# Patient Record
Sex: Male | Born: 1945 | Race: Black or African American | Hispanic: No | Marital: Single | Smoking: Current every day smoker
Health system: Southern US, Community
[De-identification: ages and names within clinical notes are randomized; demographics above are authoritative.]

## PROBLEM LIST (undated history)

## (undated) DIAGNOSIS — I10 Essential (primary) hypertension: Secondary | ICD-10-CM

## (undated) DIAGNOSIS — G8929 Other chronic pain: Secondary | ICD-10-CM

## (undated) DIAGNOSIS — M549 Dorsalgia, unspecified: Secondary | ICD-10-CM

## (undated) DIAGNOSIS — J449 Chronic obstructive pulmonary disease, unspecified: Secondary | ICD-10-CM

---

## 1998-05-28 ENCOUNTER — Emergency Department (HOSPITAL_COMMUNITY): Admission: EM | Admit: 1998-05-28 | Discharge: 1998-05-28 | Payer: Self-pay | Admitting: Emergency Medicine

## 1998-05-28 ENCOUNTER — Encounter: Payer: Self-pay | Admitting: Emergency Medicine

## 2002-01-14 ENCOUNTER — Encounter: Payer: Self-pay | Admitting: Emergency Medicine

## 2002-01-14 ENCOUNTER — Emergency Department (HOSPITAL_COMMUNITY): Admission: EM | Admit: 2002-01-14 | Discharge: 2002-01-15 | Payer: Self-pay | Admitting: Emergency Medicine

## 2012-02-29 ENCOUNTER — Encounter (HOSPITAL_COMMUNITY): Payer: Self-pay | Admitting: Emergency Medicine

## 2012-02-29 ENCOUNTER — Emergency Department (HOSPITAL_COMMUNITY)
Admission: EM | Admit: 2012-02-29 | Discharge: 2012-02-29 | Disposition: A | Discharge status: Home / Self Care | Payer: Medicare Other | Attending: Emergency Medicine | Admitting: Emergency Medicine

## 2012-02-29 ENCOUNTER — Emergency Department (HOSPITAL_COMMUNITY): Payer: Medicare Other

## 2012-02-29 DIAGNOSIS — F17200 Nicotine dependence, unspecified, uncomplicated: Secondary | ICD-10-CM | POA: Insufficient documentation

## 2012-02-29 DIAGNOSIS — S0003XA Contusion of scalp, initial encounter: Secondary | ICD-10-CM | POA: Insufficient documentation

## 2012-02-29 DIAGNOSIS — S0083XA Contusion of other part of head, initial encounter: Secondary | ICD-10-CM

## 2012-02-29 DIAGNOSIS — F10129 Alcohol abuse with intoxication, unspecified: Secondary | ICD-10-CM

## 2012-02-29 DIAGNOSIS — K047 Periapical abscess without sinus: Secondary | ICD-10-CM | POA: Insufficient documentation

## 2012-02-29 DIAGNOSIS — Z23 Encounter for immunization: Secondary | ICD-10-CM | POA: Insufficient documentation

## 2012-02-29 DIAGNOSIS — F1010 Alcohol abuse, uncomplicated: Secondary | ICD-10-CM | POA: Insufficient documentation

## 2012-02-29 DIAGNOSIS — S1093XA Contusion of unspecified part of neck, initial encounter: Secondary | ICD-10-CM | POA: Insufficient documentation

## 2012-02-29 LAB — BASIC METABOLIC PANEL
BUN: 10 mg/dL (ref 6–23)
CO2: 26 mEq/L (ref 19–32)
Calcium: 8.8 mg/dL (ref 8.4–10.5)
Chloride: 96 mEq/L (ref 96–112)
Creatinine, Ser: 0.74 mg/dL (ref 0.50–1.35)
GFR calc Af Amer: >90 mL/min (ref >90)
GFR calc non Af Amer: >90 mL/min (ref >90)
Glucose, Bld: 116 mg/dL — ABNORMAL HIGH (ref 70–99)
Potassium: 4.4 mEq/L (ref 3.5–5.1)
Sodium: 135 mEq/L (ref 135–145)

## 2012-02-29 LAB — RAPID URINE DRUG SCREEN, HOSP PERFORMED
Amphetamines: NOT DETECTED
Barbiturates: NOT DETECTED
Benzodiazepines: NOT DETECTED
Cocaine: NOT DETECTED
Opiates: NOT DETECTED
Tetrahydrocannabinol: NOT DETECTED

## 2012-02-29 LAB — CBC
HCT: 41.3 % (ref 39.0–52.0)
Hemoglobin: 14.3 g/dL (ref 13.0–17.0)
MCH: 33.6 pg (ref 26.0–34.0)
MCHC: 34.6 g/dL (ref 30.0–36.0)
MCV: 97.2 fL (ref 78.0–100.0)
Platelets: 231 10*3/uL (ref 150–400)
RBC: 4.25 MIL/uL (ref 4.22–5.81)
RDW: 14.5 % (ref 11.5–15.5)
WBC: 7.5 10*3/uL (ref 4.0–10.5)

## 2012-02-29 LAB — ETHANOL: Alcohol, Ethyl (B): 274 mg/dL — ABNORMAL HIGH (ref 0–11)

## 2012-02-29 MED ORDER — PENICILLIN V POTASSIUM 500 MG PO TABS: 500.0000 mg | ORAL_TABLET | Freq: Three times a day (TID) | ORAL | Status: DC

## 2012-02-29 MED ORDER — TETANUS-DIPHTH-ACELL PERTUSSIS 5-2.5-18.5 LF-MCG/0.5 IM SUSP
0.5000 mL | Freq: Once | INTRAMUSCULAR | Status: AC
  Administered 2012-02-29: 0.5 mL via INTRAMUSCULAR
  Filled 2012-02-29 (×2): qty 0.5
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
## 2012-02-29 NOTE — ED Notes (Signed)
Family leaving, request called if Worthy Rancher, [phone], cell
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
## 2012-03-03 NOTE — ED Provider Notes (Signed)
Medical screening examination/treatment/procedure(s) were performed by non-physician practitioner and as supervising physician I was immediately available for consultation/collaboration.   Joliyah Lippens L Miken Stecher, MD 03/03/12 0718 

## 2012-10-07 ENCOUNTER — Encounter (HOSPITAL_COMMUNITY): Payer: Self-pay

## 2012-10-07 ENCOUNTER — Emergency Department (HOSPITAL_COMMUNITY)
Admission: EM | Admit: 2012-10-07 | Discharge: 2012-10-07 | Disposition: A | Discharge status: Home / Self Care | Payer: Medicare Other | Attending: Emergency Medicine | Admitting: Emergency Medicine

## 2012-10-07 ENCOUNTER — Emergency Department (HOSPITAL_COMMUNITY): Payer: Medicare Other

## 2012-10-07 DIAGNOSIS — M545 Low back pain, unspecified: Secondary | ICD-10-CM | POA: Insufficient documentation

## 2012-10-07 DIAGNOSIS — G8929 Other chronic pain: Secondary | ICD-10-CM | POA: Insufficient documentation

## 2012-10-07 DIAGNOSIS — Z79899 Other long term (current) drug therapy: Secondary | ICD-10-CM | POA: Insufficient documentation

## 2012-10-07 DIAGNOSIS — R350 Frequency of micturition: Secondary | ICD-10-CM | POA: Insufficient documentation

## 2012-10-07 DIAGNOSIS — F17200 Nicotine dependence, unspecified, uncomplicated: Secondary | ICD-10-CM | POA: Insufficient documentation

## 2012-10-07 DIAGNOSIS — F1010 Alcohol abuse, uncomplicated: Secondary | ICD-10-CM | POA: Insufficient documentation

## 2012-10-07 DIAGNOSIS — R42 Dizziness and giddiness: Secondary | ICD-10-CM | POA: Insufficient documentation

## 2012-10-07 HISTORY — DX: Other chronic pain: G89.29

## 2012-10-07 HISTORY — DX: Dorsalgia, unspecified: M54.9

## 2012-10-07 LAB — URINALYSIS, ROUTINE W REFLEX MICROSCOPIC
Ketones, ur: NEGATIVE mg/dL
Leukocytes, UA: NEGATIVE
Nitrite: NEGATIVE
pH: 5.5 (ref 5.0–8.0)

## 2012-10-07 LAB — POCT I-STAT, CHEM 8
Calcium, Ion: 1.2 mmol/L (ref 1.13–1.30)
Chloride: 106 mEq/L (ref 96–112)
HCT: 41 % (ref 39.0–52.0)
Sodium: 138 mEq/L (ref 135–145)
TCO2: 22 mmol/L (ref 0–100)

## 2012-10-07 MED ORDER — CYCLOBENZAPRINE HCL 10 MG PO TABS: 10.0000 mg | ORAL_TABLET | Freq: Three times a day (TID) | ORAL | Status: DC | PRN

## 2012-10-07 MED ORDER — TRAMADOL HCL 50 MG PO TABS: 50.0000 mg | ORAL_TABLET | Freq: Four times a day (QID) | ORAL | Status: DC | PRN

## 2012-10-07 MED ORDER — TRAMADOL HCL 50 MG PO TABS
50.0000 mg | ORAL_TABLET | Freq: Once | ORAL | Status: AC
  Administered 2012-10-07: 50 mg via ORAL
  Filled 2012-10-07: qty 1

## 2012-10-07 NOTE — ED Provider Notes (Signed)
Medical screening examination/treatment/procedure(s) were performed by non-physician practitioner and as supervising physician I was immediately available for consultation/collaboration.  Jaxyn Rout, MD 10/07/12 0621 

## 2012-10-07 NOTE — ED Provider Notes (Signed)
CSN: [id number]     Arrival date & time 10/07/12  0034 History   First MD Initiated Contact with Patient 10/07/12 0112     Chief Complaint  Patient presents with  . Back Pain  . Dizziness   HPI  History provided by the patient. Patient is a 67 year old male with history of alcohol abuse and some chronic back pains and discomforts who presents with multiple complaints. Patient complains of his continued back pains and discomfort as well as having some lightheaded and dizziness symptoms worse with standing and some positions. He does admit to regular alcohol use and drink between 6-8 drinks today mostly beer. He does not request or wish to have any help with alcohol addiction. He is not feeling depressed, SI or HI. Patient denies having any headache or blurred vision. Denies any weakness or numbness in extremities. Denies any chest pain or palpitations. No shortness of breath. He denies any recent injuries or falls. Patient does have additional complaint of having some urinary frequency and small dribbling at times. He states "maybe my prostate". He denies any previous prostate problems. Denies any rectal pain or dysuria. Denies any hematuria. No flank pain. No other aggravating or alleviating factors. No other associated symptoms.    Past Medical History  Diagnosis Date  . Chronic back pain    History reviewed. No pertinent past surgical history. History reviewed. No pertinent family history. History  Substance Use Topics  . Smoking status: Current Every Day Smoker -- 1.00 packs/day    Types: Cigarettes  . Smokeless tobacco: Not on file  . Alcohol Use: Yes     Comment: daily    Review of Systems  Constitutional: Negative for fever, chills and diaphoresis.  Eyes: Negative for visual disturbance.  Gastrointestinal: Negative for nausea, vomiting, abdominal pain, diarrhea and constipation.  Genitourinary: Positive for frequency. Negative for dysuria, hematuria, flank pain, discharge and  penile pain.  Musculoskeletal: Positive for back pain.  Neurological: Positive for dizziness and light-headedness. Negative for weakness, numbness and headaches.  All other systems reviewed and are negative.    Allergies  Review of patient's allergies indicates no known allergies.  Home Medications   Current Outpatient Rx  Name  Route  Sig  Dispense  Refill  . Multiple Vitamin (MULTIVITAMIN WITH MINERALS) TABS tablet   Oral   Take 1 tablet by mouth daily.          BP 151/70  Pulse 87  Temp(Src) 98 F (36.7 C) (Oral)  Resp 18  SpO2 98% Physical Exam  Nursing note and vitals reviewed. Constitutional: He is oriented to person, place, and time. He appears well-developed and well-nourished. No distress.  HENT:  Head: Normocephalic and atraumatic.  No sinus pressure or nasal congestion  Eyes: Conjunctivae and EOM are normal. Pupils are equal, round, and reactive to light.  Neck: Normal range of motion. Neck supple.  No meningeal sign  Cardiovascular: Normal rate and regular rhythm.   Pulmonary/Chest: Effort normal and breath sounds normal. No respiratory distress. He has no wheezes.  Abdominal: Soft. There is no tenderness. There is no rebound.  Genitourinary:  Prostate mildly enlarged without tenderness no nodules or asymmetry. No gross blood.  Musculoskeletal: Normal range of motion.  Neurological: He is alert and oriented to person, place, and time. He has normal strength. No cranial nerve deficit or sensory deficit. Coordination and gait normal.  Skin: Skin is warm. No rash noted.  Psychiatric: He has a normal mood and affect. His behavior is  normal.    ED Course  Procedures   Labs Review Results for orders placed during the hospital encounter of 10/07/12  URINALYSIS, ROUTINE W REFLEX MICROSCOPIC      Result Value Range   Color, Urine YELLOW  YELLOW   APPearance CLEAR  CLEAR   Specific Gravity, Urine 1.011  1.005 - 1.030   pH 5.5  5.0 - 8.0   Glucose, UA  NEGATIVE  NEGATIVE mg/dL   Hgb urine dipstick NEGATIVE  NEGATIVE   Bilirubin Urine NEGATIVE  NEGATIVE   Ketones, ur NEGATIVE  NEGATIVE mg/dL   Protein, ur NEGATIVE  NEGATIVE mg/dL   Urobilinogen, UA 0.2  0.0 - 1.0 mg/dL   Nitrite NEGATIVE  NEGATIVE   Leukocytes, UA NEGATIVE  NEGATIVE  POCT I-STAT, CHEM 8      Result Value Range   Sodium 138  135 - 145 mEq/L   Potassium 3.6  3.5 - 5.1 mEq/L   Chloride 106  96 - 112 mEq/L   BUN 11  6 - 23 mg/dL   Creatinine, Ser 1.61  0.50 - 1.35 mg/dL   Glucose, Bld 91  70 - 99 mg/dL   Calcium, Ion 0.96  0.45 - 1.30 mmol/L   TCO2 22  0 - 100 mmol/L   Hemoglobin 13.9  13.0 - 17.0 g/dL   HCT 40.9  81.1 - 91.4 %     Imaging Review Dg Lumbar Spine Complete  10/07/2012   *RADIOLOGY REPORT*  Clinical Data: Back pain  LUMBAR SPINE - COMPLETE 4+ VIEW  Comparison: None.  Findings: Rightward curvature of the lumbar spine.  Sacralized L5. Lower lumbar/lumbosacral degenerative disc disease and facet arthropathy.  No displaced fracture.  No dislocation. Atherosclerotic vascular calcifications.  IMPRESSION: Lower lumbar/lumbosacral degenerative changes.  No acute osseous finding.   Original Report Authenticated By: Jearld Lesch, M.D.    MDM   1. Chronic back pain   2. Alcohol abuse       Patient seen and evaluated. Patient appears well in no acute distress. Patient does admit to alcohol use throughout the day but does not appear significantly intoxicated. He is awake and alert and oriented x3. He has normal ambulation without assistance.  The patient's back discomforts are chronic. There are no new changes or concerning symptoms. No red flag symptoms. Patient has normal nonfocal neuro exam. Does become slightly lightheaded with standing without any significant orthostatic changes. His dizziness and lightheadedness his brief may also be related to his alcohol use.      Angus Seller, PA-C 10/07/12 [phone]

## 2012-10-07 NOTE — ED Notes (Signed)
Changed acuity.  Pt now also stating that has had dizziness for 2 weeks.

## 2012-10-07 NOTE — ED Notes (Signed)
Pt suspects he may be having prostate complications due to frequent urination.

## 2012-10-07 NOTE — ED Notes (Signed)
Pt has had ongoing pain for 7 years in back.  Pt states pain in lower back, non radiating.  No trauma noted.  Pt ambulating on arrival.  States ETOH on board.  Pain meds in past and out of meds.  Vitals 158/78, hr 90, cbg 92, resp 18

## 2014-08-26 ENCOUNTER — Emergency Department (HOSPITAL_COMMUNITY): Payer: Medicare (Managed Care)

## 2014-08-26 ENCOUNTER — Encounter (HOSPITAL_COMMUNITY): Payer: Self-pay

## 2014-08-26 ENCOUNTER — Emergency Department (HOSPITAL_COMMUNITY)
Admission: EM | Admit: 2014-08-26 | Discharge: 2014-08-26 | Disposition: A | Discharge status: Home / Self Care | Payer: Medicare (Managed Care) | Attending: Emergency Medicine | Admitting: Emergency Medicine

## 2014-08-26 DIAGNOSIS — G8929 Other chronic pain: Secondary | ICD-10-CM | POA: Diagnosis not present

## 2014-08-26 DIAGNOSIS — Z720 Tobacco use: Secondary | ICD-10-CM | POA: Diagnosis not present

## 2014-08-26 DIAGNOSIS — R05 Cough: Secondary | ICD-10-CM

## 2014-08-26 DIAGNOSIS — R059 Cough, unspecified: Secondary | ICD-10-CM

## 2014-08-26 DIAGNOSIS — R042 Hemoptysis: Secondary | ICD-10-CM | POA: Diagnosis not present

## 2014-08-26 LAB — BASIC METABOLIC PANEL
ANION GAP: 7 (ref 5–15)
BUN: 11 mg/dL (ref 6–20)
CALCIUM: 9.4 mg/dL (ref 8.9–10.3)
CHLORIDE: 104 mmol/L (ref 101–111)
CO2: 25 mmol/L (ref 22–32)
Creatinine, Ser: 0.8 mg/dL (ref 0.61–1.24)
GFR calc Af Amer: >60 mL/min (ref >60)
GFR calc non Af Amer: >60 mL/min (ref >60)
Glucose, Bld: 112 mg/dL — ABNORMAL HIGH (ref 65–99)
Potassium: 4.1 mmol/L (ref 3.5–5.1)
Sodium: 136 mmol/L (ref 135–145)

## 2014-08-26 LAB — CBC WITH DIFFERENTIAL/PLATELET
BASOS ABS: 0 10*3/uL (ref 0.0–0.1)
Basophils Relative: 0 % (ref 0–1)
EOS ABS: 0.1 10*3/uL (ref 0.0–0.7)
EOS PCT: 1 % (ref 0–5)
HEMATOCRIT: 42.2 % (ref 39.0–52.0)
Hemoglobin: 14.6 g/dL (ref 13.0–17.0)
LYMPHS ABS: 1.4 10*3/uL (ref 0.7–4.0)
LYMPHS PCT: 27 % (ref 12–46)
MCH: 32.2 pg (ref 26.0–34.0)
MCHC: 34.6 g/dL (ref 30.0–36.0)
MCV: 93.2 fL (ref 78.0–100.0)
MONOS PCT: 8 % (ref 3–12)
Monocytes Absolute: 0.4 10*3/uL (ref 0.1–1.0)
NEUTROS ABS: 3.4 10*3/uL (ref 1.7–7.7)
NEUTROS PCT: 64 % (ref 43–77)
Platelets: 173 10*3/uL (ref 150–400)
RBC: 4.53 MIL/uL (ref 4.22–5.81)
RDW: 14.2 % (ref 11.5–15.5)
WBC: 5.3 10*3/uL (ref 4.0–10.5)

## 2014-08-26 LAB — I-STAT TROPONIN, ED: Troponin i, poc: 0 ng/mL (ref 0.00–0.08)

## 2014-08-26 LAB — D-DIMER, QUANTITATIVE: D-Dimer, Quant: <0.27 ug/mL-FEU (ref 0.00–0.48)

## 2014-08-26 MED ORDER — PREDNISONE 20 MG PO TABS: 40.0000 mg | ORAL_TABLET | Freq: Every day | ORAL | Status: DC

## 2014-08-26 MED ORDER — AZITHROMYCIN 250 MG PO TABS: 250.0000 mg | ORAL_TABLET | Freq: Every day | ORAL | Status: DC

## 2014-08-26 NOTE — ED Notes (Signed)
Questions r/t dc were denied. Pt ambulatory and a&ox4 

## 2014-08-26 NOTE — ED Provider Notes (Signed)
CSN: [id number]     Arrival date & time 08/26/14  1137 History   First MD Initiated Contact with Patient 08/26/14 1202     Chief Complaint  Patient presents with  . Cough  . Hemoptysis     (Consider location/radiation/quality/duration/timing/severity/associated sxs/prior Treatment) Patient is a 69 y.o. male presenting with cough. The history is provided by the patient and medical records.  Cough   This is a 69 y.o. M with hx of chronic back pain, presenting to the ED for cough.  Patient states he has been having productive cough for 2 weeks.  States over the past few days he has started noticing some "flecks" of blood in his sputum.  States is varying colors of red.  Patient states he has occasional SOB, states this is chronic for him.  Denies significant increase in SOB.  Denies fever, chills, sweats.  No sick contacts.  Denies chest pain, abdominal pain, N/V/D.  patient has no known cardiac history. He is a daily smoker.  No hx of DVT or PE.  No recent trauma, travel, surgeries, or prolonged immobilization.  VSS.  Past Medical History  Diagnosis Date  . Chronic back pain    History reviewed. No pertinent past surgical history. No family history on file. History  Substance Use Topics  . Smoking status: Current Every Day Smoker -- 1.00 packs/day    Types: Cigarettes  . Smokeless tobacco: Not on file  . Alcohol Use: Yes     Comment: daily    Review of Systems  Respiratory: Positive for cough.   All other systems reviewed and are negative.     Allergies  Review of patient's allergies indicates no known allergies.  Home Medications   Prior to Admission medications   Medication Sig Start Date End Date Taking? Authorizing Provider  acetaminophen (PAIN RELIEF) 325 MG tablet Take 650 mg by mouth every 6 (six) hours as needed for moderate pain.   Yes Historical Provider, MD  Multiple Vitamin (MULTIVITAMIN WITH MINERALS) TABS tablet Take 1 tablet by mouth daily.   Yes Historical  Provider, MD  cyclobenzaprine (FLEXERIL) 10 MG tablet Take 1 tablet (10 mg total) by mouth 3 (three) times daily as needed for muscle spasms. Patient not taking: Reported on 08/26/2014 10/07/12   Hazel Sams, PA-C  traMADol (ULTRAM) 50 MG tablet Take 1 tablet (50 mg total) by mouth every 6 (six) hours as needed for pain. Patient not taking: Reported on 08/26/2014 10/07/12   Hazel Sams, PA-C   BP 127/79 mmHg  Pulse 100  Temp(Src) 98.1 F (36.7 C) (Oral)  Resp 18  SpO2 99%   Physical Exam  Constitutional: He is oriented to person, place, and time. He appears well-developed and well-nourished. No distress.  HENT:  Head: Normocephalic and atraumatic.  Mouth/Throat: Oropharynx is clear and moist.  Eyes: Conjunctivae and EOM are normal. Pupils are equal, round, and reactive to light.  Neck: Normal range of motion. Neck supple.  Cardiovascular: Normal rate, regular rhythm and normal heart sounds.   Pulmonary/Chest: Effort normal. No respiratory distress. He has no wheezes.  Respirations unlabored, no wheezes or rhonchi, O2 sats 99% on RA  Abdominal: Soft. Bowel sounds are normal. There is no tenderness. There is no guarding.  Musculoskeletal: Normal range of motion.  Neurological: He is alert and oriented to person, place, and time.  Skin: Skin is warm and dry. He is not diaphoretic.  Psychiatric: He has a normal mood and affect.  Nursing note and vitals reviewed.  ED Course  Procedures (including critical care time) Labs Review Labs Reviewed  BASIC METABOLIC PANEL - Abnormal; Notable for the following:    Glucose, Bld 112 (*)    All other components within normal limits  CBC WITH DIFFERENTIAL/PLATELET  D-DIMER, QUANTITATIVE (NOT AT [facility])  Randolm Idol, ED    Imaging Review Dg Chest 2 View  08/26/2014   CLINICAL DATA:  Shortness of breath and hemoptysis for 1 week.  EXAM: CHEST  2 VIEW  COMPARISON:  None.  FINDINGS: Heart size is normal. Pulmonary hyperinflation is  consistent with COPD. Coarse opacity is seen in both lung bases in the right upper lobe. This may be due to scarring although some active infiltrate cannot definitely be excluded without previous studies for comparison. There is no evidence of pleural effusion. No definite mass or lymphadenopathy identified.  IMPRESSION: Coarse opacity in both lower lobes and right upper lobe, which may be due to scarring, although some active inflammatory or infectious process cannot definitely be excluded. Previous outside chest radiographs would be helpful for comparison if available.  COPD.   Electronically Signed   By: Earle Gell M.D.   On: 08/26/2014 14:05     EKG Interpretation   Date/Time:  Friday August 26 2014 12:13:04 EDT Ventricular Rate:  86 PR Interval:  138 QRS Duration: 82 QT Interval:  384 QTC Calculation: 459 R Axis:   52 Text Interpretation:  Sinus rhythm Right atrial enlargement Consider left  ventricular hypertrophy Confirmed by Hazle Coca [phone]) on 08/26/2014  2:53:57 PM      MDM   Final diagnoses:  Cough  Hemoptysis   69 year old male here with productive cough over the past 2 weeks, now with "flecks" of blood in his sputum. Patient is afebrile, nontoxic. He denies any chest pain.  Patient has mild, chronic shortness of breath, denies changes from his baseline.  EKG is reassuring. Lab work including d-dimer unremarkable. Chest x-ray with questionable scarring versus inflammatory/infectious process.  Given patient's symptoms, this may represent pneumonia. Feel less likely ACS, PE, dissection, or other acute cardiac event at this time. Will treat with azithromycin and prednisone. Patient does not have a PCP in the area, he was given referrals and resources guide to help establish care.  Discussed plan with patient, he/she acknowledged understanding and agreed with plan of care.  Return precautions given for new or worsening symptoms.  Larene Pickett, PA-C 08/26/14 [place], MD 08/26/14 [phone]

## 2014-08-26 NOTE — Discharge Instructions (Signed)
Take the prescribed medication as directed. Follow-up with a primary care physician in the area.  Please see resource guide to help find a Dr. in your area. Return to the ED for new or worsening symptoms.

## 2014-08-26 NOTE — ED Notes (Signed)
Nurse currently drawing labs 

## 2014-08-26 NOTE — ED Notes (Signed)
Pt presents with c/o cough that he reports has been present for approx 3 weeks. Pt also reporting that he has been noticing some blood when he coughs in his sputum since last week. Pt reporting some low back pain as well. Pt reports no new injury to his back, injury to that area approx 20 years ago.

## 2014-11-07 ENCOUNTER — Emergency Department (HOSPITAL_COMMUNITY): Payer: Medicare (Managed Care)

## 2014-11-07 ENCOUNTER — Inpatient Hospital Stay (HOSPITAL_COMMUNITY)
Admission: EM | Admit: 2014-11-07 | Discharge: 2014-11-14 | DRG: 166 | Disposition: A | Discharge status: Skilled Nursing Facility | Payer: Medicare (Managed Care) | Attending: Internal Medicine | Admitting: Internal Medicine

## 2014-11-07 ENCOUNTER — Ambulatory Visit
Admit: 2014-11-07 | Discharge: 2014-11-07 | Disposition: A | Discharge status: Home / Self Care | Payer: Medicare Other | Attending: Radiation Oncology | Admitting: Radiation Oncology

## 2014-11-07 ENCOUNTER — Encounter (HOSPITAL_COMMUNITY): Payer: Self-pay | Admitting: Emergency Medicine

## 2014-11-07 DIAGNOSIS — C342 Malignant neoplasm of middle lobe, bronchus or lung: Principal | ICD-10-CM | POA: Diagnosis present

## 2014-11-07 DIAGNOSIS — T380X5A Adverse effect of glucocorticoids and synthetic analogues, initial encounter: Secondary | ICD-10-CM | POA: Diagnosis not present

## 2014-11-07 DIAGNOSIS — C7931 Secondary malignant neoplasm of brain: Secondary | ICD-10-CM

## 2014-11-07 DIAGNOSIS — R200 Anesthesia of skin: Secondary | ICD-10-CM | POA: Diagnosis present

## 2014-11-07 DIAGNOSIS — R001 Bradycardia, unspecified: Secondary | ICD-10-CM | POA: Diagnosis present

## 2014-11-07 DIAGNOSIS — G9340 Encephalopathy, unspecified: Secondary | ICD-10-CM | POA: Diagnosis not present

## 2014-11-07 DIAGNOSIS — C7970 Secondary malignant neoplasm of unspecified adrenal gland: Secondary | ICD-10-CM | POA: Diagnosis present

## 2014-11-07 DIAGNOSIS — C3491 Malignant neoplasm of unspecified part of right bronchus or lung: Secondary | ICD-10-CM | POA: Insufficient documentation

## 2014-11-07 DIAGNOSIS — F17200 Nicotine dependence, unspecified, uncomplicated: Secondary | ICD-10-CM | POA: Diagnosis present

## 2014-11-07 DIAGNOSIS — H53149 Visual discomfort, unspecified: Secondary | ICD-10-CM | POA: Diagnosis not present

## 2014-11-07 DIAGNOSIS — M6289 Other specified disorders of muscle: Secondary | ICD-10-CM | POA: Diagnosis not present

## 2014-11-07 DIAGNOSIS — J449 Chronic obstructive pulmonary disease, unspecified: Secondary | ICD-10-CM | POA: Diagnosis present

## 2014-11-07 DIAGNOSIS — R51 Headache: Secondary | ICD-10-CM

## 2014-11-07 DIAGNOSIS — R739 Hyperglycemia, unspecified: Secondary | ICD-10-CM | POA: Diagnosis not present

## 2014-11-07 DIAGNOSIS — C3490 Malignant neoplasm of unspecified part of unspecified bronchus or lung: Secondary | ICD-10-CM

## 2014-11-07 DIAGNOSIS — G8929 Other chronic pain: Secondary | ICD-10-CM | POA: Diagnosis present

## 2014-11-07 DIAGNOSIS — R911 Solitary pulmonary nodule: Secondary | ICD-10-CM

## 2014-11-07 DIAGNOSIS — G936 Cerebral edema: Secondary | ICD-10-CM | POA: Diagnosis present

## 2014-11-07 DIAGNOSIS — Z7189 Other specified counseling: Secondary | ICD-10-CM | POA: Insufficient documentation

## 2014-11-07 DIAGNOSIS — M549 Dorsalgia, unspecified: Secondary | ICD-10-CM

## 2014-11-07 DIAGNOSIS — F1010 Alcohol abuse, uncomplicated: Secondary | ICD-10-CM | POA: Diagnosis present

## 2014-11-07 DIAGNOSIS — E279 Disorder of adrenal gland, unspecified: Secondary | ICD-10-CM | POA: Diagnosis present

## 2014-11-07 DIAGNOSIS — M545 Low back pain: Secondary | ICD-10-CM | POA: Diagnosis present

## 2014-11-07 DIAGNOSIS — R531 Weakness: Secondary | ICD-10-CM | POA: Diagnosis not present

## 2014-11-07 DIAGNOSIS — M6281 Muscle weakness (generalized): Secondary | ICD-10-CM | POA: Diagnosis present

## 2014-11-07 DIAGNOSIS — E43 Unspecified severe protein-calorie malnutrition: Secondary | ICD-10-CM | POA: Diagnosis present

## 2014-11-07 DIAGNOSIS — I10 Essential (primary) hypertension: Secondary | ICD-10-CM | POA: Diagnosis present

## 2014-11-07 DIAGNOSIS — E871 Hypo-osmolality and hyponatremia: Secondary | ICD-10-CM | POA: Diagnosis present

## 2014-11-07 DIAGNOSIS — F17210 Nicotine dependence, cigarettes, uncomplicated: Secondary | ICD-10-CM | POA: Diagnosis present

## 2014-11-07 DIAGNOSIS — R918 Other nonspecific abnormal finding of lung field: Secondary | ICD-10-CM

## 2014-11-07 DIAGNOSIS — Z515 Encounter for palliative care: Secondary | ICD-10-CM | POA: Insufficient documentation

## 2014-11-07 DIAGNOSIS — Z681 Body mass index (BMI) 19 or less, adult: Secondary | ICD-10-CM

## 2014-11-07 DIAGNOSIS — R590 Localized enlarged lymph nodes: Secondary | ICD-10-CM | POA: Insufficient documentation

## 2014-11-07 DIAGNOSIS — C7800 Secondary malignant neoplasm of unspecified lung: Secondary | ICD-10-CM

## 2014-11-07 DIAGNOSIS — J849 Interstitial pulmonary disease, unspecified: Secondary | ICD-10-CM | POA: Diagnosis present

## 2014-11-07 DIAGNOSIS — C801 Malignant (primary) neoplasm, unspecified: Secondary | ICD-10-CM

## 2014-11-07 DIAGNOSIS — R05 Cough: Secondary | ICD-10-CM

## 2014-11-07 DIAGNOSIS — Z720 Tobacco use: Secondary | ICD-10-CM

## 2014-11-07 LAB — DIFFERENTIAL
BASOS ABS: 0 10*3/uL (ref 0.0–0.1)
BASOS PCT: 1 %
EOS ABS: 0.1 10*3/uL (ref 0.0–0.7)
Eosinophils Relative: 3 %
Lymphocytes Relative: 29 %
Lymphs Abs: 1.3 10*3/uL (ref 0.7–4.0)
Monocytes Absolute: 0.5 10*3/uL (ref 0.1–1.0)
Monocytes Relative: 12 %
NEUTROS PCT: 55 %
Neutro Abs: 2.5 10*3/uL (ref 1.7–7.7)

## 2014-11-07 LAB — CBC
HCT: 40.5 % (ref 39.0–52.0)
Hemoglobin: 13.7 g/dL (ref 13.0–17.0)
MCH: 31.4 pg (ref 26.0–34.0)
MCHC: 33.8 g/dL (ref 30.0–36.0)
MCV: 92.9 fL (ref 78.0–100.0)
Platelets: 184 10*3/uL (ref 150–400)
RBC: 4.36 MIL/uL (ref 4.22–5.81)
RDW: 12.7 % (ref 11.5–15.5)
WBC: 4.4 10*3/uL (ref 4.0–10.5)

## 2014-11-07 LAB — URINALYSIS, ROUTINE W REFLEX MICROSCOPIC
BILIRUBIN URINE: NEGATIVE
GLUCOSE, UA: NEGATIVE mg/dL
HGB URINE DIPSTICK: NEGATIVE
Ketones, ur: NEGATIVE mg/dL
Leukocytes, UA: NEGATIVE
Nitrite: NEGATIVE
PH: 5.5 (ref 5.0–8.0)
Protein, ur: NEGATIVE mg/dL
SPECIFIC GRAVITY, URINE: 1.01 (ref 1.005–1.030)
UROBILINOGEN UA: 1 mg/dL (ref 0.0–1.0)

## 2014-11-07 LAB — BASIC METABOLIC PANEL
Anion gap: 6 (ref 5–15)
BUN: 16 mg/dL (ref 6–20)
CALCIUM: 8.9 mg/dL (ref 8.9–10.3)
CO2: 22 mmol/L (ref 22–32)
Chloride: 106 mmol/L (ref 101–111)
Creatinine, Ser: 0.87 mg/dL (ref 0.61–1.24)
GFR calc Af Amer: >60 mL/min (ref >60)
GLUCOSE: 94 mg/dL (ref 65–99)
POTASSIUM: 3.5 mmol/L (ref 3.5–5.1)
Sodium: 134 mmol/L — ABNORMAL LOW (ref 135–145)

## 2014-11-07 LAB — APTT: APTT: 31 s (ref 24–37)

## 2014-11-07 LAB — RAPID URINE DRUG SCREEN, HOSP PERFORMED
AMPHETAMINES: NOT DETECTED
BENZODIAZEPINES: NOT DETECTED
Barbiturates: NOT DETECTED
Cocaine: NOT DETECTED
OPIATES: NOT DETECTED
TETRAHYDROCANNABINOL: NOT DETECTED

## 2014-11-07 LAB — I-STAT TROPONIN, ED: Troponin i, poc: 0 ng/mL (ref 0.00–0.08)

## 2014-11-07 LAB — PROTIME-INR
INR: 1.2 (ref 0.00–1.49)
Prothrombin Time: 15.4 seconds — ABNORMAL HIGH (ref 11.6–15.2)

## 2014-11-07 LAB — ETHANOL: Alcohol, Ethyl (B): <5 mg/dL (ref <5)

## 2014-11-07 MED ORDER — SODIUM CHLORIDE 0.9 % IJ SOLN
3.0000 mL | Freq: Two times a day (BID) | INTRAMUSCULAR | Status: DC
  Administered 2014-11-07 – 2014-11-14 (×14): 3 mL via INTRAVENOUS

## 2014-11-07 MED ORDER — HYDROMORPHONE HCL 1 MG/ML IJ SOLN
1.0000 mg | INTRAMUSCULAR | Status: DC | PRN
  Administered 2014-11-07 – 2014-11-12 (×7): 1 mg via INTRAVENOUS
  Filled 2014-11-07 (×7): qty 1

## 2014-11-07 MED ORDER — DEXAMETHASONE SODIUM PHOSPHATE 10 MG/ML IJ SOLN
10.0000 mg | Freq: Once | INTRAMUSCULAR | Status: AC
  Administered 2014-11-07: 10 mg via INTRAVENOUS
  Filled 2014-11-07: qty 1

## 2014-11-07 MED ORDER — ONDANSETRON HCL 4 MG/2ML IJ SOLN
4.0000 mg | Freq: Four times a day (QID) | INTRAMUSCULAR | Status: DC | PRN
  Administered 2014-11-07: 4 mg via INTRAVENOUS
  Filled 2014-11-07: qty 2

## 2014-11-07 MED ORDER — ONDANSETRON HCL 4 MG PO TABS: 4.0000 mg | ORAL_TABLET | Freq: Four times a day (QID) | ORAL | Status: DC | PRN

## 2014-11-07 MED ORDER — MORPHINE SULFATE (PF) 4 MG/ML IV SOLN
4.0000 mg | Freq: Once | INTRAVENOUS | Status: AC
  Administered 2014-11-07: 4 mg via INTRAVENOUS
  Filled 2014-11-07: qty 1

## 2014-11-07 MED ORDER — ALBUTEROL SULFATE (2.5 MG/3ML) 0.083% IN NEBU: 2.5000 mg | INHALATION_SOLUTION | RESPIRATORY_TRACT | Status: DC | PRN

## 2014-11-07 MED ORDER — MORPHINE SULFATE (PF) 4 MG/ML IV SOLN: 4.0000 mg | INTRAVENOUS | Status: DC | PRN

## 2014-11-07 MED ORDER — HYDRALAZINE HCL 20 MG/ML IJ SOLN
5.0000 mg | INTRAMUSCULAR | Status: DC | PRN
  Administered 2014-11-11 – 2014-11-12 (×2): 5 mg via INTRAVENOUS
  Filled 2014-11-07 (×2): qty 1

## 2014-11-07 MED ORDER — NICOTINE 21 MG/24HR TD PT24
21.0000 mg | MEDICATED_PATCH | Freq: Every day | TRANSDERMAL | Status: DC
  Administered 2014-11-07 – 2014-11-14 (×8): 21 mg via TRANSDERMAL
  Filled 2014-11-07 (×8): qty 1

## 2014-11-07 MED ORDER — DEXAMETHASONE SODIUM PHOSPHATE 4 MG/ML IJ SOLN
4.0000 mg | INTRAMUSCULAR | Status: DC
  Administered 2014-11-07 – 2014-11-14 (×43): 4 mg via INTRAVENOUS
  Filled 2014-11-07 (×50): qty 1

## 2014-11-07 MED ORDER — IOHEXOL 300 MG/ML  SOLN
100.0000 mL | Freq: Once | INTRAMUSCULAR | Status: AC | PRN
  Administered 2014-11-07: 100 mL via INTRAVENOUS

## 2014-11-07 MED ORDER — IOHEXOL 300 MG/ML  SOLN
50.0000 mL | Freq: Once | INTRAMUSCULAR | Status: AC | PRN
  Administered 2014-11-07: 50 mL via ORAL

## 2014-11-07 NOTE — ED Provider Notes (Signed)
CSN: [id number]     Arrival date & time 11/07/14  0206 History   This chart was scribed for Casey Porter, MD by Forrestine Him, ED Scribe. This patient was seen in room WA25/WA25 and the patient's care was started 2:33 AM.   Chief Complaint  Patient presents with  . Numbness    left sided    The history is provided by the patient. No language interpreter was used.    HPI Comments: Casey Charles R hand dominant, brought in by EMS is a 69 y.o. male without any pertinent past medical history who presents to the Emergency Department complaining of constant, ongoing L sided numbness and weakness when he got up just before 2 am to use the bathroom. He was fine when he went to bed at 10 am. States he was unable to ambulate independently because his left leg was weak and made it back to his bed with the help of his brother. [name] also reports a L sided HA.  No recent fever, chills, chest pain, shortness of breath, abdominal pain, nausea, or vomiting. No recent falls, injury, or trauma.  He is an every day smoker and typically smokes 1 pack of cigarettes a day. States "I drink maybe 1-2 beers a day". Pt is now retired and used to do concrete work.  PCP none  Past Medical History  Diagnosis Date  . Chronic back pain    History reviewed. No pertinent past surgical history. History reviewed. No pertinent family history. Social History  Substance Use Topics  . Smoking status: Current Every Day Smoker -- 1.00 packs/day    Types: Cigarettes  . Smokeless tobacco: None  . Alcohol Use: Yes     Comment: daily  lives at home  Lives with mother and brother Drinks 1-2 beers daily retired  Review of Systems  All other systems reviewed and are negative.     Allergies  Review of patient's allergies indicates no known allergies.  Home Medications   None Triage Vitals: BP 167/81 mmHg  Pulse 78  Temp(Src) 98 F (36.7 C) (Oral)  Resp 18  Ht '6\' 1"'$  (1.854 m)  Wt 147 lb (66.679 kg)  BMI 19.40  kg/m2  SpO2 98%  Vital signs normal     Physical Exam  Constitutional: He is oriented to person, place, and time. He appears well-developed and well-nourished.  Non-toxic appearance. He does not appear ill. No distress.  HENT:  Head: Normocephalic and atraumatic.  Right Ear: External ear normal.  Left Ear: External ear normal.  Nose: Nose normal. No mucosal edema or rhinorrhea.  Mouth/Throat: Oropharynx is clear and moist and mucous membranes are normal. No dental abscesses or uvula swelling.  Eyes: Conjunctivae and EOM are normal. Pupils are equal, round, and reactive to light.  Neck: Normal range of motion and full passive range of motion without pain. Neck supple.  Cardiovascular: Normal rate, regular rhythm and normal heart sounds.  Exam reveals no gallop and no friction rub.   No murmur heard. Pulmonary/Chest: Effort normal and breath sounds normal. No respiratory distress. He has no wheezes. He has no rhonchi. He has no rales. He exhibits no tenderness and no crepitus.  Abdominal: Soft. Normal appearance and bowel sounds are normal. He exhibits no distension. There is no tenderness. There is no rebound and no guarding.  Musculoskeletal: Normal range of motion. He exhibits no edema or tenderness.  Moves all extremities well.   Neurological: He is alert and oriented to person, place,  and time. He has normal strength. No cranial nerve deficit.  Weak L grip L sided pronator drift Keeps L knee flexed and can not straighten it Lifts his L leg against gravity with difficulty  Speech is mildly dysarthric   Skin: Skin is warm, dry and intact. No rash noted. No erythema. No pallor.  Psychiatric: He has a normal mood and affect. His speech is normal and behavior is normal. His mood appears not anxious.  Nursing note and vitals reviewed.   ED Course  Procedures (including critical care time) Medications  dexamethasone (DECADRON) injection 10 mg (not administered)  iohexol (OMNIPAQUE)  300 MG/ML solution 50 mL (50 mLs Oral Contrast Given 11/07/14 0349)  morphine 4 MG/ML injection 4 mg (4 mg Intravenous Given 11/07/14 0349)  iohexol (OMNIPAQUE) 300 MG/ML solution 100 mL (100 mLs Intravenous Contrast Given 11/07/14 0508)     DIAGNOSTIC STUDIES: Oxygen Saturation is 98% on RA, Normal by my interpretation.    COORDINATION OF CARE: 2:38 AM- Will order CT head without contrast, ethanol, i-stat troponin, urine rapid drug screen, urinalysis, i-stat troponin, CBC, APTT, PT-INR, BMP, and EKG. Discussed treatment plan with pt at bedside and pt agreed to plan.     2:58 AM- Discussed with Dr. Aram Beecham- on call for Neurology. CT not resulted yet.  States pt is out of window for TPA. Have hospitalist admit to [facility]  3:40 AM- Spoke with Dr. Pascal Lux from radiology. 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 On reviewing CXR from July and history of COPD, he feels lung cancer is likely the source.  Patient was complaining of severe back pain. He was given IV morphine.  06:00 Pt given his test results. Since he has gotten morphine and he is having trouble focusing when I talk to him, having trouble making eye contact, seems restless.  06:14 Pt discussed with Dr Arnoldo Morale, states steroids are okay with hemorrhagic mets, 10 mg every 6 hrs  Labs Review Results for orders placed or performed during the hospital encounter of 11/07/14  CBC  Result Value Ref Range   WBC 4.4 4.0 - 10.5 K/uL   RBC 4.36 4.22 - 5.81 MIL/uL   Hemoglobin 13.7 13.0 - 17.0 g/dL   HCT 40.5 39.0 - 52.0 %   MCV 92.9 78.0 - 100.0 fL   MCH 31.4 26.0 - 34.0 pg   MCHC 33.8 30.0 - 36.0 g/dL   RDW 12.7 11.5 - 15.5 %   Platelets 184 150 - 400 K/uL  Differential  Result Value Ref Range   Neutrophils Relative % 55 %   Neutro Abs 2.5 1.7 - 7.7 K/uL   Lymphocytes Relative 29 %   Lymphs Abs 1.3 0.7 - 4.0 K/uL   Monocytes Relative 12 %   Monocytes Absolute 0.5 0.1 - 1.0 K/uL   Eosinophils  Relative 3 %   Eosinophils Absolute 0.1 0.0 - 0.7 K/uL   Basophils Relative 1 %   Basophils Absolute 0.0 0.0 - 0.1 K/uL  Protime-INR  Result Value Ref Range   Prothrombin Time 15.4 (H) 11.6 - 15.2 seconds   INR 1.20 0.00 - 1.49  APTT  Result Value Ref Range   aPTT 31 24 - 37 seconds  Basic metabolic panel  Result Value Ref Range   Sodium 134 (L) 135 - 145 mmol/L   Potassium 3.5 3.5 - 5.1 mmol/L   Chloride 106 101 - 111 mmol/L   CO2 22 22 - 32 mmol/L   Glucose, Bld  94 65 - 99 mg/dL   BUN 16 6 - 20 mg/dL   Creatinine, Ser 0.87 0.61 - 1.24 mg/dL   Calcium 8.9 8.9 - 10.3 mg/dL   GFR calc non Af Amer >60 >60 mL/min   GFR calc Af Amer >60 >60 mL/min   Anion gap 6 5 - 15  Ethanol  Result Value Ref Range   Alcohol, Ethyl (B) <5 <5 mg/dL  Urine rapid drug screen (hosp performed)not at [facility]  Result Value Ref Range   Opiates NONE DETECTED NONE DETECTED   Cocaine NONE DETECTED NONE DETECTED   Benzodiazepines NONE DETECTED NONE DETECTED   Amphetamines NONE DETECTED NONE DETECTED   Tetrahydrocannabinol NONE DETECTED NONE DETECTED   Barbiturates NONE DETECTED NONE DETECTED  Urinalysis, Routine w reflex microscopic (not at [facility])  Result Value Ref Range   Color, Urine YELLOW YELLOW   APPearance CLEAR CLEAR   Specific Gravity, Urine 1.010 1.005 - 1.030   pH 5.5 5.0 - 8.0   Glucose, UA NEGATIVE NEGATIVE mg/dL   Hgb urine dipstick NEGATIVE NEGATIVE   Bilirubin Urine NEGATIVE NEGATIVE   Ketones, ur NEGATIVE NEGATIVE mg/dL   Protein, ur NEGATIVE NEGATIVE mg/dL   Urobilinogen, UA 1.0 0.0 - 1.0 mg/dL   Nitrite NEGATIVE NEGATIVE   Leukocytes, UA NEGATIVE NEGATIVE  I-stat troponin, ED  (not at [facility])  Result Value Ref Range   Troponin i, poc 0.00 0.00 - 0.08 ng/mL   Comment 3           Laboratory interpretation all normal     Imaging Review Dg Chest 2 View  11/07/2014   CLINICAL DATA:  Abnormal head CT. Smoking history. Evaluate for cancer.  EXAM: CHEST  2 VIEW  COMPARISON:   08/26/2014  FINDINGS: Right middle lobe pulmonary nodule, most discrete in the lateral projection and measuring 16 mm.  Borderline cardiomegaly. Saber trachea correlating with COPD changes. Hyperinflation, apical hyperinflation, and diffuse interstitial coarsening. Focal scarring with calcification at the right apex.  IMPRESSION: 1. Right middle lobe pulmonary nodule, correlate with pending chest CT. 2. Chronic lung disease including COPD.   Electronically Signed   By: Monte Fantasia M.D.   On: 11/07/2014 05:20   Ct Head Wo Contrast  11/07/2014   CLINICAL DATA:  Left-sided numbness from head to feet upon awakening. Left-sided headache.  EXAM: CT HEAD WITHOUT CONTRAST  TECHNIQUE: Contiguous axial images were obtained from the base of the skull through the vertex without intravenous contrast.  COMPARISON:  02/29/2012  FINDINGS: Skull and Sinuses:Negative for fracture or destructive process. The mastoids, middle ears, and imaged paranasal sinuses are clear.  Orbits: No acute abnormality.  Brain: Multiple high-density masses, at least 11 in the right cerebral hemisphere, 19 in the left cerebral hemisphere, and 6 infratentorial (including a 19 mm mass in the lower pons). Variable degrees of surrounding edema with local mass effect but no shift or herniation. No subarachnoid or intraventricular hemorrhage. No evidence of acute infarct. There is a background of atrophy and chronic small vessel disease.  Critical Value/emergent results were called by telephone at the time of interpretation on 11/07/2014 at 3:38 am to Dr. Rolland Charles , who verbally acknowledged these results.  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 Associated variable vasogenic edema with mild local mass effect.   Electronically Signed   By: Monte Fantasia M.D.   On: 11/07/2014 03:44   Ct Chest W Contrast Ct Abdomen Pelvis W Contrast  11/07/2014   CLINICAL DATA:  Possible metastatic disease.  Brain masses  EXAM:  CT CHEST, ABDOMEN, AND PELVIS WITH CONTRAST  TECHNIQUE: Multidetector CT imaging of the chest, abdomen and pelvis was performed following the standard protocol during bolus administration of intravenous contrast.  CONTRAST:  56m OMNIPAQUE IOHEXOL 300 MG/ML SOLN, 1050mOMNIPAQUE IOHEXOL 300 MG/ML SOLN  COMPARISON:  None.  FINDINGS: CT CHEST FINDINGS  THORACIC INLET/BODY WALL:  No acute abnormality.  MEDIASTINUM:  Normal heart size. No pericardial effusion. Diffuse atherosclerosis, including the coronary arteries.  Right upper paratracheal, left lower paratracheal, subcarinal, and right hilar lymphadenopathy with central low-density necrotic appearance. The largest node is sub- carinal, 4 cm in diameter. Presumed primary in the right middle lobe measuring 24 mm in diameter. No additional suspicious pulmonary nodules.  LUNG WINDOWS:  Reticulation the lower lungs which is unusual, favoring an interstitial lung disease without honeycombing. Extensive right upper lobe scarring and volume loss with airway obstruction and bronchiectasis. Multiple calcified granulomas.  Saber trachea and hyperinflation.  OSSEOUS:  No acute fracture.  No suspicious lytic or blastic lesions.  CT ABDOMEN PELVIS FINDINGS  Hepatobiliary: No focal liver abnormality.No evidence of biliary obstruction or stone.  Pancreas: Unremarkable.  Spleen: Unremarkable.  Adrenals/Urinary Tract: Bilateral adrenal nodules, heterogeneous in appearance and measuring up to 25 mm on the right.  No hydronephrosis or stone.  Distended and thick walled bladder, likely chronic outlet obstruction.  Reproductive:Prostatomegaly, projecting into the bladder base.  Stomach/Bowel:  No obstruction. No appendicitis.  Vascular/Lymphatic: No acute vascular abnormality. No definitive malignant adenopathy.  Peritoneal: No ascites or pneumoperitoneum.  Musculoskeletal: No acute abnormalities. Advanced lower lumbar degenerative disc and facet disease.  IMPRESSION: 1. Intrathoracic  malignancy, likely bronchogenic carcinoma. There is a right middle lobe pulmonary nodule with right hilar and bilateral mediastinal lymphadenopathy. Bilateral adrenal masses, presumed metastases. 2. Interstitial lung disease. Granulomatous right upper lobe scarring, likely postinfectious.   Electronically Signed   By: JoMonte Fantasia.D.   On: 11/07/2014 05:51   I have personally reviewed and evaluated these images and lab results as part of my medical decision-making.   EKG Interpretation   Date/Time:  Monday November 07 2014 03:20:56 EDT Ventricular Rate:  67 PR Interval:  161 QRS Duration: 77 QT Interval:  419 QTC Calculation: 442 R Axis:   44 Text Interpretation:  Sinus arrhythmia Prominent P waves, nondiagnostic  Left ventricular hypertrophy Since last tracing rate slower (26 Aug 2014)  Confirmed by KNAPP  MD-I, IVA ([id number] 11/07/2014 3:28:22 AM      MDM   Final diagnoses:  Left-sided muscle weakness  Metastasis to brain  Lung nodule    Plan admission  IvRolland PorterMD, FACEP    I personally performed the services described in this documentation, which was scribed in my presence. The recorded information has been reviewed and considered.  IvRolland PorterMD, FABarbette OrMD 11/07/14 [id number]

## 2014-11-07 NOTE — H&P (Addendum)
[facility] History and Physical  Casey Charles WLN:[id number] DOB: 1945-07-01 DOA: 11/07/2014  Referring physician: ED physician, Dr. Tomi Bamberger PCP: Pt has no PCP, case manager consulted placed for assistance   Chief Complaint: left upper extremity numbness and weakness   HPI:  Pt is 69 yo male, smoker, 1 ppd for 30+ yrs, drinks 1-2 beers per day, with no previous known medical conditions, presented to [facility] ED via EMS for evaluation of sudden onset of LUE weakness and numbness that started around 2 am when he woke up to go to the bathroom. This has progressed to involve LLE and he was eventually unable to ambulate independently. Pt reports noticing generalized headaches over the past week, worse on the left side, intermittent and throbbing, 7/10 in severity when present, occasionally but not consistently radiating to the entire head, no specific alleviating or aggravating factors, no specific associated symptoms such as fevers, chills, visual changes. Patient reports having chronic lower and upper back pain, somewhat worse over the past week, constant, 5/5 in severity, with no specific alleviating factors including with use of Tylenol that he has been taking at home. Patient also denies chest pain or shortness of breath, no specific abdominal or urinary concerns. Patient denies similar events in the past. Please note that patient is somewhat poor historian at the time of the admission due to distress from left sided weakness.  In emergency department, vital signs notable for heart rate 55 bpm, otherwise unremarkable. Blood work notable for sodium 134, otherwise normal. Imaging studies notable for chronic lung disease, COPD with right middle lobe pulmonary nodule. CT chest confirming a nodule with concern of bronchogenic carcinoma. CT head notable for numerous hemorrhagic masses consistent with metastatic disease. TRH asked to admit for further evaluation, telemetry bed requested.  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 Nicole Kindred neurologist on call, placed on seizure precautions     Acute left-sided weakness, LUE > LLE - secondary to hemorrhagic brain masses - monitor neruo status Q4 hours - continue with Decadron as noted above  - will hold off on PT evaluation for now until we obtain recommendations from specialists oncologist and radiation oncology     Accelerated hypertension - place on hydralazine as needed for now     COPD (chronic obstructive pulmonary disease) - Respiratory status stable this morning, patient maintaining oxygen saturation at target range - No need for additional oxygen at this time - Allow broncho-dilators as needed    Current smoker - We will address smoking cessation  once patient more medically stable - place on Nicotine patch     Acute hyponatremia - Could be secondary to prerenal etiology, poor oral intake - We'll repeat BMP in the morning    Bradycardia - Asymptomatic, monitor on telemetry for now    Severe PCM -  nutritionist consulted     Acute on chronic back pain - Certainly concern for potential bony metastases - Prior to proceeding with further imaging, we will follow-up on recommendations of oncologist, radiation oncologist, palliative care team - Continue Dilaudid IV for now as needed    DVT prophylaxis  - SCD's    Consultants  Oncology - Dr Burr Medico  Neurology - Dr. Nicole Kindred  Radiation oncology - spoke with Dr. Isidore Moos   PCCM for bronchoscopy    Palliative care team   Radiological Exams on Admission: Dg Chest 2 View 11/07/2014   Right middle lobe pulmonary nodule, correlate with pending chest CT. 2. Chronic lung disease including COPD.    Ct Head Wo Contrast 11/07/2014   Numerous brain masses most consistent with hemorrhagic metastases. Associated variable vasogenic edema with mild local mass effect.     Ct Chest, Abdomen Pelvis W Contrast 11/07/2014  Intrathoracic malignancy, likely bronchogenic carcinoma. There is a right middle lobe pulmonary nodule with right hilar and bilateral mediastinal lymphadenopathy. Bilateral adrenal masses, presumed metastases.  Interstitial lung disease. Granulomatous right upper lobe scarring, likely postinfectious.     Code Status: Full Family Communication: Pt at bedside Disposition Plan: Admit for further evaluation    [facility] [phone]  Review of Systems:  Constitutional: Negative for fever, chills. Negative for diaphoresis.  HENT: Negative for hearing loss, ear pain, nosebleeds, congestion, sore throat, neck pain, tinnitus and ear discharge.   Eyes: Negative for photophobia, pain, discharge and redness.  Respiratory: Negative for cough, hemoptysis,  wheezing and stridor.   Cardiovascular: Negative for chest pain, palpitations, orthopnea, claudication and leg swelling.  Gastrointestinal:  Negative for heartburn, constipation, blood in stool and melena.  Genitourinary: Negative for dysuria, urgency, frequency, hematuria and flank  pain.  Musculoskeletal: Negative for myalgias, joint pain and falls.  Skin: Negative for itching and rash.  Neurological: Per HPI Endo/Heme/Allergies: Negative for environmental allergies and polydipsia. Does not bruise/bleed easily.  Psychiatric/Behavioral: Negative for suicidal ideas. The patient is not nervous/anxious.      Past Medical History  Diagnosis Date  . Chronic back pain    Social History - reports that he has been smoking Cigarettes.    No Known Allergies  Family history - pt cannot think of the specific family problems at this time, not aware of HTN or DM in the family   Medication Sig  acetaminophen (PAIN RELIEF) 325 MG tablet Take 650 mg by mouth every 6 (six) hours as needed for moderate pain.   Physical Exam: Filed Vitals:   11/07/14 0330 11/07/14 0400 11/07/14 0430 11/07/14 0650  BP: 177/78 172/73 164/78   Pulse: 71 64 55   Temp:    97.8 F (36.6 C)  TempSrc:    Oral  Resp: '19 11 16   '$ Height:      Weight:      SpO2: 99% 99% 98%     Physical Exam  Constitutional: Appears to be in mild distress, concerned with left sided weakness HENT: Normocephalic. External right and left ear normal. Oropharynx is clear and moist.  Eyes: Conjunctivae and EOM are normal. PERRLA, no scleral icterus.  Neck: Normal ROM. Neck supple. No JVD. No tracheal deviation.  No thyromegaly.  CVS: RRR, S1/S2 +, no murmurs, no gallops, no carotid bruit.  Pulmonary: Effort and breath sounds normal, no stridor, rhonchi, wheezes, rales.  Abdominal: Soft. BS +,  no distension, tenderness, rebound or guarding.  Musculoskeletal: No edema and no tenderness.  Lymphadenopathy: No lymphadenopathy noted, cervical, inguinal. Neuro: Alert and oriented 3, lifting right upper extremity and lower extremity with no difficulty, significant difficulty lifting left upper and lower extremity, weak left grip, left sided pronator drift, unable to straighten left knee. Speech slightly dysarthric Skin: Skin  is warm and dry. No rash noted. Not diaphoretic. No erythema. No pallor.  Psychiatric: Normal mood and affect.  Labs on Admission:  Basic Metabolic Panel:  Recent Labs Lab 11/07/14 0233  NA 134*  K 3.5  CL 106  CO2 22  GLUCOSE 94  BUN 16  CREATININE 0.87  CALCIUM 8.9   CBC:  Recent Labs Lab 11/07/14 0233  WBC 4.4  NEUTROABS 2.5  HGB 13.7  HCT 40.5  MCV 92.9  PLT 184   EKG: sinus bradycardia   If 7PM-7AM, please contact night-coverage [email or web address] Password [facility] 11/07/2014, 7:52 AM

## 2014-11-07 NOTE — ED Notes (Addendum)
Per pt, remembers waking up to go to the bathroom tonight and felt numbness from head to his feet. " I noticed that my whole left side went out on me" Pt was referring to not being able to feel his side.  Pt unable to scoot up to the head of the bed without assistance. He states that he is weak. More weakness noted on his left side.

## 2014-11-07 NOTE — ED Notes (Addendum)
Pt woke up around 0100 with left sided weakness and called EMS. EMS stoke scale was negative. Behavior is erratic per EMS. Pt endorses drinking alcohol today with his brother, unknown amount, endorses everyday drinking, stroke screen negative at this initial triage encounter, pt AOx4.

## 2014-11-07 NOTE — ED Notes (Signed)
Bed: DG73 Expected date:  Expected time:  Means of arrival:  Comments: 51M L sided numbness/weakness

## 2014-11-07 NOTE — Consult Note (Signed)
Name: Casey Charles MRN: [id number] DOB: December 22, 1945    ADMISSION DATE:  11/07/2014 CONSULTATION DATE:  11/07/14  REFERRING MD :  Dr. Doyle Askew   CHIEF COMPLAINT:  Pulmonary nodules, LAN, brain lesions.  Concern for malignancy    BRIEF PATIENT DESCRIPTION: 69 y/o M, smoker, admitted 9/26 with sudden onset LUE weakness/numbness.  Evaluation led to a CT of the chest & head.  Findings concerning for pulmonary nodules with concern for bronchogenic carcinoma and numerous hemorrhagic masses in the brain.  PCCM consulted for possible tissues biopsy of lung lesions.    SIGNIFICANT EVENTS  9/26  Admit with acute onset LUE weakness.  Found to have pulmonary nodules concerning for malignancy & brain lesions  STUDIES:  CT Head 9/26 >> numerous brain masses consistent with hemorrhagic metastases, associated variable vasogenic edema with local mass effect   CT Chest / ABD / Pelvis 9/26 >> intrathoracic malignancy, RML pulmonary nodule with R hilar & bilateral mediastinal LAN, bilateral adrenal masses, presumed metastases, ILD, granulomatous RUL scarring     HISTORY OF PRESENT ILLNESS:  69 y/o M, smoker (1ppd x 30+ years) and ETOH (1-2 beer per day, does not have withdrawal sx if he doesn't drink), admitted 9/26 with sudden onset LUE weakness/numbness.  The patient reported on admission he woke around 2am and attempted to go to the restroom.  He noted LUE weakness and it progressed to LLE weakness.  He eventually was unable to walk.  He has also noted a headache over the past week.  He has chronic back pain which has been worse over the past week as well.  ER work up was notable for bradycardia, Na 134, negative troponin, and normal CBC.  CXR was evaluated and it showed a RML pulmonary nodule and changes consistent with COPD.  He was further evaluated with a CT of the chest, abdomen and pelvis which demonstrated concern for intrathoracic malignancy, RML pulmonary nodule with R hilar & bilateral mediastinal  LAN, bilateral adrenal masses, presumed metastases, ILD, granulomatous RUL scarring.  Case was discussed with interventional radiology but it was felt area would not be amenable to IR guided biopsy.  PCCM consulted for evaluation and possible tissue biopsy.     PAST MEDICAL HISTORY :   has a past medical history of Chronic back pain.  has no past surgical history on file.   Prior to Admission medications   Medication Sig Start Date End Date Taking? Authorizing Travares Nelles  acetaminophen (PAIN RELIEF) 325 MG tablet Take 650 mg by mouth every 6 (six) hours as needed for moderate pain.   Yes Historical Miela Desjardin, MD  Multiple Vitamin (MULTIVITAMIN WITH MINERALS) TABS tablet Take 1 tablet by mouth daily.   Yes Historical Magdaleno Lortie, MD  azithromycin (ZITHROMAX) 250 MG tablet Take 1 tablet (250 mg total) by mouth daily. Take first 2 tablets together, then 1 every day until finished. Patient not taking: Reported on 11/07/2014 08/26/14   Larene Pickett, PA-C  cyclobenzaprine (FLEXERIL) 10 MG tablet Take 1 tablet (10 mg total) by mouth 3 (three) times daily as needed for muscle spasms. Patient not taking: Reported on 08/26/2014 10/07/12   Hazel Sams, PA-C  predniSONE (DELTASONE) 20 MG tablet Take 2 tablets (40 mg total) by mouth daily. Take 40 mg by mouth daily for 3 days, then '20mg'$  by mouth daily for 3 days, then '10mg'$  daily for 3 days Patient not taking: Reported on 11/07/2014 08/26/14   Larene Pickett, PA-C  traMADol (ULTRAM) 50 MG tablet Take  1 tablet (50 mg total) by mouth every 6 (six) hours as needed for pain. Patient not taking: Reported on 08/26/2014 10/07/12   Hazel Sams, PA-C   No Known Allergies  FAMILY HISTORY:  family history is not on file.   SOCIAL HISTORY:  reports that he has been smoking Cigarettes.  He has been smoking about 1.00 pack per day. He does not have any smokeless tobacco history on file. He reports that he drinks alcohol. He reports that he does not use illicit drugs.  REVIEW  OF SYSTEMS:   Gen: Denies fever, chills, weight change, fatigue, night sweats HEENT: Denies blurred vision, double vision, hearing loss, tinnitus, sinus congestion, rhinorrhea, sore throat, neck stiffness, dysphagia PULM: Denies shortness of breath, cough, sputum production, hemoptysis, wheezing CV: Denies chest pain, edema, orthopnea, paroxysmal nocturnal dyspnea, palpitations GI: Denies abdominal pain, nausea, vomiting, diarrhea, hematochezia, melena, constipation, change in bowel habits GU: Denies dysuria, hematuria, polyuria, oliguria, urethral discharge Endocrine: Denies hot or cold intolerance, polyuria, polyphagia or appetite change Derm: Denies rash, dry skin, scaling or peeling skin change Heme: Denies easy bruising, bleeding, bleeding gums Neuro: Denies slurred speech, loss of memory or consciousness.  Reports headache, back pain, and L sided weakness.    SUBJECTIVE:  Pt reports ongoing weakness on left side.    VITAL SIGNS: Temp:  [97.8 F (36.6 C)-98 F (36.7 C)] 97.8 F (36.6 C) (09/26 0900) Pulse Rate:  [55-78] 55 (09/26 0900) Resp:  [11-19] 18 (09/26 0900) BP: (164-177)/(61-81) 168/61 mmHg (09/26 0900) SpO2:  [98 %-99 %] 99 % (09/26 0900) Weight:  [147 lb (66.679 kg)] 147 lb (66.679 kg) (09/26 0329)  PHYSICAL EXAMINATION: General:  Thin adult male in NAD Neuro:  Awake, alert, difficult to understand speech but clear, gross motor of LUE, weak grip.  R grip 5/5 HEENT:  MM pink/moist, no jvd Cardiovascular:  s1s2 rrr, no m/r/g Lungs:  resp's even/ non-labored on RA, lungs bilaterally clear  Abdomen:  Flat, soft, bsx4 active  Musculoskeletal:  No acute deformities  Skin:  Warm/dry,no edema, rashes or lesions    Recent Labs Lab 11/07/14 0233  NA 134*  K 3.5  CL 106  CO2 22  BUN 16  CREATININE 0.87  GLUCOSE 94    Recent Labs Lab 11/07/14 0233  HGB 13.7  HCT 40.5  WBC 4.4  PLT 184   Dg Chest 2 View  11/07/2014   CLINICAL DATA:  Abnormal head CT.  Smoking history. Evaluate for cancer.  EXAM: CHEST  2 VIEW  COMPARISON:  08/26/2014  FINDINGS: Right middle lobe pulmonary nodule, most discrete in the lateral projection and measuring 16 mm.  Borderline cardiomegaly. Saber trachea correlating with COPD changes. Hyperinflation, apical hyperinflation, and diffuse interstitial coarsening. Focal scarring with calcification at the right apex.  IMPRESSION: 1. Right middle lobe pulmonary nodule, correlate with pending chest CT. 2. Chronic lung disease including COPD.   Electronically Signed   By: Monte Fantasia M.D.   On: 11/07/2014 05:20   Ct Head Wo Contrast  11/07/2014   CLINICAL DATA:  Left-sided numbness from head to feet upon awakening. Left-sided headache.  EXAM: CT HEAD WITHOUT CONTRAST  TECHNIQUE: Contiguous axial images were obtained from the base of the skull through the vertex without intravenous contrast.  COMPARISON:  02/29/2012  FINDINGS: Skull and Sinuses:Negative for fracture or destructive process. The mastoids, middle ears, and imaged paranasal sinuses are clear.  Orbits: No acute abnormality.  Brain: Multiple high-density masses, at least 11 in the right  cerebral hemisphere, 19 in the left cerebral hemisphere, and 6 infratentorial (including a 19 mm mass in the lower pons). Variable degrees of surrounding edema with local mass effect but no shift or herniation. No subarachnoid or intraventricular hemorrhage. No evidence of acute infarct. There is a background of atrophy and chronic small vessel disease.  Critical Value/emergent results were called by telephone at the time of interpretation on 11/07/2014 at 3:38 am to Dr. Rolland Porter , who verbally acknowledged these results.  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 Associated variable vasogenic edema with mild local mass effect.   Electronically Signed   By: Monte Fantasia M.D.   On: 11/07/2014 03:44   Ct Chest W Contrast  11/07/2014   CLINICAL DATA:   Possible metastatic disease.  Brain masses  EXAM: CT CHEST, ABDOMEN, AND PELVIS WITH CONTRAST  TECHNIQUE: Multidetector CT imaging of the chest, abdomen and pelvis was performed following the standard protocol during bolus administration of intravenous contrast.  CONTRAST:  31m OMNIPAQUE IOHEXOL 300 MG/ML SOLN, 1033mOMNIPAQUE IOHEXOL 300 MG/ML SOLN  COMPARISON:  None.  FINDINGS: CT CHEST FINDINGS  THORACIC INLET/BODY WALL:  No acute abnormality.  MEDIASTINUM:  Normal heart size. No pericardial effusion. Diffuse atherosclerosis, including the coronary arteries.  Right upper paratracheal, left lower paratracheal, subcarinal, and right hilar lymphadenopathy with central low-density necrotic appearance. The largest node is sub- carinal, 4 cm in diameter. Presumed primary in the right middle lobe measuring 24 mm in diameter. No additional suspicious pulmonary nodules.  LUNG WINDOWS:  Reticulation the lower lungs which is unusual, favoring an interstitial lung disease without honeycombing. Extensive right upper lobe scarring and volume loss with airway obstruction and bronchiectasis. Multiple calcified granulomas.  Saber trachea and hyperinflation.  OSSEOUS:  No acute fracture.  No suspicious lytic or blastic lesions.  CT ABDOMEN PELVIS FINDINGS  Hepatobiliary: No focal liver abnormality.No evidence of biliary obstruction or stone.  Pancreas: Unremarkable.  Spleen: Unremarkable.  Adrenals/Urinary Tract: Bilateral adrenal nodules, heterogeneous in appearance and measuring up to 25 mm on the right.  No hydronephrosis or stone.  Distended and thick walled bladder, likely chronic outlet obstruction.  Reproductive:Prostatomegaly, projecting into the bladder base.  Stomach/Bowel:  No obstruction. No appendicitis.  Vascular/Lymphatic: No acute vascular abnormality. No definitive malignant adenopathy.  Peritoneal: No ascites or pneumoperitoneum.  Musculoskeletal: No acute abnormalities. Advanced lower lumbar degenerative disc  and facet disease.  IMPRESSION: 1. Intrathoracic malignancy, likely bronchogenic carcinoma. There is a right middle lobe pulmonary nodule with right hilar and bilateral mediastinal lymphadenopathy. Bilateral adrenal masses, presumed metastases. 2. Interstitial lung disease. Granulomatous right upper lobe scarring, likely postinfectious.   Electronically Signed   By: JoMonte Fantasia.D.   On: 11/07/2014 05:51   Ct Abdomen Pelvis W Contrast  11/07/2014   CLINICAL DATA:  Possible metastatic disease.  Brain masses  EXAM: CT CHEST, ABDOMEN, AND PELVIS WITH CONTRAST  TECHNIQUE: Multidetector CT imaging of the chest, abdomen and pelvis was performed following the standard protocol during bolus administration of intravenous contrast.  CONTRAST:  5037mMNIPAQUE IOHEXOL 300 MG/ML SOLN, 100m106mNIPAQUE IOHEXOL 300 MG/ML SOLN  COMPARISON:  None.  FINDINGS: CT CHEST FINDINGS  THORACIC INLET/BODY WALL:  No acute abnormality.  MEDIASTINUM:  Normal heart size. No pericardial effusion. Diffuse atherosclerosis, including the coronary arteries.  Right upper paratracheal, left lower paratracheal, subcarinal, and right hilar lymphadenopathy with central low-density necrotic appearance. The largest node is sub- carinal, 4 cm in diameter. Presumed primary in the right middle  lobe measuring 24 mm in diameter. No additional suspicious pulmonary nodules.  LUNG WINDOWS:  Reticulation the lower lungs which is unusual, favoring an interstitial lung disease without honeycombing. Extensive right upper lobe scarring and volume loss with airway obstruction and bronchiectasis. Multiple calcified granulomas.  Saber trachea and hyperinflation.  OSSEOUS:  No acute fracture.  No suspicious lytic or blastic lesions.  CT ABDOMEN PELVIS FINDINGS  Hepatobiliary: No focal liver abnormality.No evidence of biliary obstruction or stone.  Pancreas: Unremarkable.  Spleen: Unremarkable.  Adrenals/Urinary Tract: Bilateral adrenal nodules, heterogeneous in  appearance and measuring up to 25 mm on the right.  No hydronephrosis or stone.  Distended and thick walled bladder, likely chronic outlet obstruction.  Reproductive:Prostatomegaly, projecting into the bladder base.  Stomach/Bowel:  No obstruction. No appendicitis.  Vascular/Lymphatic: No acute vascular abnormality. No definitive malignant adenopathy.  Peritoneal: No ascites or pneumoperitoneum.  Musculoskeletal: No acute abnormalities. Advanced lower lumbar degenerative disc and facet disease.  IMPRESSION: 1. Intrathoracic malignancy, likely bronchogenic carcinoma. There is a right middle lobe pulmonary nodule with right hilar and bilateral mediastinal lymphadenopathy. Bilateral adrenal masses, presumed metastases. 2. Interstitial lung disease. Granulomatous right upper lobe scarring, likely postinfectious.   Electronically Signed   By: Monte Fantasia M.D.   On: 11/07/2014 05:51    ASSESSMENT / PLAN:  Right Middle Lobe Nodule - 24 mm Right LAN  Left Sided Weakness - inability to stand independently   Discussion:  69 y/o M with 30 year hx of smoking admitted with left sided weakness.  Found to have a RML pulmonary nodule, associated LAN and metastatic lesions in brain.     Plan: Will need to determine what type of social support the patient has at home.  His mother is listed as his emergency contact.  I attempted to call her but she did not answer.  It is the same phone number listed for the patient and the mother (he is almost 20, ?? If she is still living).  EBUS can be scheduled as an outpatient but I do not think he physically would be capable of making it to a procedure as an outpatient with left sided weakness.   Await Endo scheduling for possible procedure time.  Spoke with Sharee Pimple 9/26.  The first open slot will be Friday 9/30 after 12 pm.    Will follow up with scheduling / timing of procedure.        Noe Gens, NP-C [facility] Pgr: [phone] or if no answer  [phone] 11/07/2014, 1:08 PM

## 2014-11-07 NOTE — Consult Note (Signed)
Radiation Oncology [phone] ________________________________  Name: Casey Charles MinorMRN: [id number] Date: 9/26/2016DOB: February 06, 1946  REFERRING PHYSICIAN: Mart Piggs, MD   DIAGNOSIS: The primary encounter diagnosis was Left-sided muscle weakness. Diagnoses of Metastasis to brain, Lung nodule, and Lung cancer were also pertinent to this visit.   HISTORY OF PRESENT ILLNESS::Casey Charles is a 69 y.o. male who is seen for an initial consultation visit regarding the patient's diagnosis of brain metastases. The patient was admitted to [facility] with progressive left sided weakness/numbness over one week. PMH + smoker, 1 ppd for 30+ yrs, drinks 1-2 beers per day, with no previous known medical conditions. Patient reports noticing generalized headaches over the past week, worse on the left side, intermittent and throbbing, 7/10 in severity when present, occasionally but not consistently radiating to the entire head, no specific alleviating or aggravating factors, no specific associated symptoms such as fevers, chills, visual changes. Patient reports having chronic lower and upper back pain, somewhat worse over the past week, constant, 5/5 in severity, with no specific alleviating factors including with use of Tylenol that he has been taking at home. Patient also denies chest pain or shortness of breath, no specific abdominal or urinary concerns. Patient found to have probable lung masses with brain mets.Swallow evaluation ordered.  Denies breathing changes. Patient states he has lost ~25 lbs.   PREVIOUS RADIATION THERAPY: No   PAST MEDICAL HISTORY:  has a past medical history of Chronic back pain.    PAST SURGICAL HISTORY:History reviewed. No pertinent past surgical history.   FAMILY HISTORY: family history is not on file.   SOCIAL HISTORY:  reports that he has been smoking Cigarettes. He has been smoking about 1.00 pack per day. He does not  have any smokeless tobacco history on file. He reports that he drinks alcohol. He reports that he does not use illicit drugs.   ALLERGIES: Review of patient's allergies indicates no known allergies.   MEDICATIONS:  Current Facility-Administered Medications  Medication Dose Route Frequency Provider Last Rate Last Dose  . albuterol (PROVENTIL) (2.5 MG/3ML) 0.083% nebulizer solution 2.5 mg 2.5 mg Nebulization Q2H PRN Theodis Blaze, MD    . dexamethasone (DECADRON) injection 4 mg 4 mg Intravenous 6 times per day Theodis Blaze, MD  4 mg at 11/07/14 1248  . hydrALAZINE (APRESOLINE) injection 5 mg 5 mg Intravenous Q4H PRN Theodis Blaze, MD    . HYDROmorphone (DILAUDID) injection 1 mg 1 mg Intravenous Q2H PRN Theodis Blaze, MD    . nicotine (NICODERM CQ - dosed in mg/24 hours) patch 21 mg 21 mg Transdermal Daily Theodis Blaze, MD    . ondansetron [facility]) tablet 4 mg 4 mg Oral Q6H PRN Theodis Blaze, MD     Or  . ondansetron [facility]) injection 4 mg 4 mg Intravenous Q6H PRN Theodis Blaze, MD    . sodium chloride 0.9 % injection 3 mL 3 mL Intravenous Q12H Theodis Blaze, MD  3 mL at 11/07/14 1250     REVIEW OF SYSTEMS: A 15 point review of systems is documented in the electronic medical record. This was obtained by the nursing staff. However, I reviewed this with the patient to discuss relevant findings and make appropriate changes. Pertinent items are noted in HPI.   PHYSICAL EXAM:  height is '6\' 1"'$  (1.854 m) and weight is 147 lb (66.679 kg). His oral temperature is 97.8 F (36.6 C). His blood pressure is 175/91 and his pulse is 55. His respiration  is 18 and oxygen saturation is 99%.   4+/5 left extremity weakness. 5/5 strength in right upper extremity General: Well-developed, in no acute distress HEENT: Normocephalic, atraumatic Cardiovascular: Regular rate and rhythm Respiratory: Coarse breath  sounds bilaterally GI: Soft, nontender, normal bowel sounds Extremities: No edema present  ECOG = 2  0 - Asymptomatic (Fully active, able to carry on all predisease activities without restriction)  1 - Symptomatic but completely ambulatory (Restricted in physically strenuous activity but ambulatory and able to carry out work of a light or sedentary nature. For example, light housework, office work)  2 - Symptomatic, <50% in bed during the day (Ambulatory and capable of all self care but unable to carry out any work activities. Up and about more than 50% of waking hours)  3 - Symptomatic, >50% in bed, but not bedbound (Capable of only limited self-care, confined to bed or chair 50% or more of waking hours)  4 - Bedbound (Completely disabled. Cannot carry on any self-care. Totally confined to bed or chair)  5 - Death  Eustace Pen MM, Creech RH, Tormey DC, et al. [phone]). "Toxicity and response criteria of the [facility]". [place] Oncol. 5 (6): 649-55  _   LABORATORY DATA:   Recent Labs    Lab Results  Component Value Date   WBC 4.4 11/07/2014   HGB 13.7 11/07/2014   HCT 40.5 11/07/2014   MCV 92.9 11/07/2014   PLT 184 11/07/2014      Recent Labs    Lab Results  Component Value Date   NA 134* 11/07/2014   K 3.5 11/07/2014   CL 106 11/07/2014   CO2 22 11/07/2014      Recent Labs    No results found for: ALT, AST, GGT, ALKPHOS, BILITOT      Imaging Results    RADIOGRAPHY: Dg Chest 2 View  11/07/2014 CLINICAL DATA: Abnormal head CT. Smoking history. Evaluate for cancer. EXAM: CHEST 2 VIEW COMPARISON: 08/26/2014 FINDINGS: Right middle lobe pulmonary nodule, most discrete in the lateral projection and measuring 16 mm. Borderline cardiomegaly. Saber trachea correlating with COPD changes. Hyperinflation, apical hyperinflation, and diffuse interstitial coarsening. Focal scarring with calcification at  the right apex. IMPRESSION: 1. Right middle lobe pulmonary nodule, correlate with pending chest CT. 2. Chronic lung disease including COPD. Electronically Signed By: Monte Fantasia M.D. On: 11/07/2014 05:20   Ct Head Wo Contrast  11/07/2014 CLINICAL DATA: Left-sided numbness from head to feet upon awakening. Left-sided headache. EXAM: CT HEAD WITHOUT CONTRAST TECHNIQUE: Contiguous axial images were obtained from the base of the skull through the vertex without intravenous contrast. COMPARISON: 02/29/2012 FINDINGS: Skull and Sinuses:Negative for fracture or destructive process. The mastoids, middle ears, and imaged paranasal sinuses are clear. Orbits: No acute abnormality. Brain: Multiple high-density masses, at least 11 in the right cerebral hemisphere, 19 in the left cerebral hemisphere, and 6 infratentorial (including a 19 mm mass in the lower pons). Variable degrees of surrounding edema with local mass effect but no shift or herniation. No subarachnoid or intraventricular hemorrhage. No evidence of acute infarct. There is a background of atrophy and chronic small vessel disease. Critical Value/emergent results were called by telephone at the time of interpretation on 11/07/2014 at 3:38 am to Dr. Rolland Porter , who verbally acknowledged these results. 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 Associated variable vasogenic edema with mild local mass effect. Electronically Signed By: Monte Fantasia M.D. On: 11/07/2014 03:44   Ct Chest W Contrast  11/07/2014 CLINICAL DATA: Possible metastatic disease. Brain masses EXAM: CT CHEST, ABDOMEN, AND PELVIS WITH CONTRAST TECHNIQUE: Multidetector CT imaging of the chest, abdomen and pelvis was performed following the standard protocol during bolus administration of intravenous contrast. CONTRAST: 24m OMNIPAQUE IOHEXOL 300 MG/ML SOLN, 105mOMNIPAQUE IOHEXOL 300 MG/ML SOLN COMPARISON: None. FINDINGS:  CT CHEST FINDINGS THORACIC INLET/BODY WALL: No acute abnormality. MEDIASTINUM: Normal heart size. No pericardial effusion. Diffuse atherosclerosis, including the coronary arteries. Right upper paratracheal, left lower paratracheal, subcarinal, and right hilar lymphadenopathy with central low-density necrotic appearance. The largest node is sub- carinal, 4 cm in diameter. Presumed primary in the right middle lobe measuring 24 mm in diameter. No additional suspicious pulmonary nodules. LUNG WINDOWS: Reticulation the lower lungs which is unusual, favoring an interstitial lung disease without honeycombing. Extensive right upper lobe scarring and volume loss with airway obstruction and bronchiectasis. Multiple calcified granulomas. Saber trachea and hyperinflation. OSSEOUS: No acute fracture. No suspicious lytic or blastic lesions. CT ABDOMEN PELVIS FINDINGS Hepatobiliary: No focal liver abnormality.No evidence of biliary obstruction or stone. Pancreas: Unremarkable. Spleen: Unremarkable. Adrenals/Urinary Tract: Bilateral adrenal nodules, heterogeneous in appearance and measuring up to 25 mm on the right. No hydronephrosis or stone. Distended and thick walled bladder, likely chronic outlet obstruction. Reproductive:Prostatomegaly, projecting into the bladder base. Stomach/Bowel: No obstruction. No appendicitis. Vascular/Lymphatic: No acute vascular abnormality. No definitive malignant adenopathy. Peritoneal: No ascites or pneumoperitoneum. Musculoskeletal: No acute abnormalities. Advanced lower lumbar degenerative disc and facet disease. IMPRESSION: 1. Intrathoracic malignancy, likely bronchogenic carcinoma. There is a right middle lobe pulmonary nodule with right hilar and bilateral mediastinal lymphadenopathy. Bilateral adrenal masses, presumed metastases. 2. Interstitial lung disease. Granulomatous right upper lobe scarring, likely postinfectious. Electronically Signed By: JoMonte FantasiaM.D. On: 11/07/2014 05:51   Ct Abdomen Pelvis W Contrast  11/07/2014 CLINICAL DATA: Possible metastatic disease. Brain masses EXAM: CT CHEST, ABDOMEN, AND PELVIS WITH CONTRAST TECHNIQUE: Multidetector CT imaging of the chest, abdomen and pelvis was performed following the standard protocol during bolus administration of intravenous contrast. CONTRAST: 508mMNIPAQUE IOHEXOL 300 MG/ML SOLN, 100m42mNIPAQUE IOHEXOL 300 MG/ML SOLN COMPARISON: None. FINDINGS: CT CHEST FINDINGS THORACIC INLET/BODY WALL: No acute abnormality. MEDIASTINUM: Normal heart size. No pericardial effusion. Diffuse atherosclerosis, including the coronary arteries. Right upper paratracheal, left lower paratracheal, subcarinal, and right hilar lymphadenopathy with central low-density necrotic appearance. The largest node is sub- carinal, 4 cm in diameter. Presumed primary in the right middle lobe measuring 24 mm in diameter. No additional suspicious pulmonary nodules. LUNG WINDOWS: Reticulation the lower lungs which is unusual, favoring an interstitial lung disease without honeycombing. Extensive right upper lobe scarring and volume loss with airway obstruction and bronchiectasis. Multiple calcified granulomas. Saber trachea and hyperinflation. OSSEOUS: No acute fracture. No suspicious lytic or blastic lesions. CT ABDOMEN PELVIS FINDINGS Hepatobiliary: No focal liver abnormality.No evidence of biliary obstruction or stone. Pancreas: Unremarkable. Spleen: Unremarkable. Adrenals/Urinary Tract: Bilateral adrenal nodules, heterogeneous in appearance and measuring up to 25 mm on the right. No hydronephrosis or stone. Distended and thick walled bladder, likely chronic outlet obstruction. Reproductive:Prostatomegaly, projecting into the bladder base. Stomach/Bowel: No obstruction. No appendicitis. Vascular/Lymphatic: No acute vascular abnormality. No definitive malignant adenopathy. Peritoneal: No ascites or  pneumoperitoneum. Musculoskeletal: No acute abnormalities. Advanced lower lumbar degenerative disc and facet disease. IMPRESSION: 1. Intrathoracic malignancy, likely bronchogenic carcinoma. There is a right middle lobe pulmonary nodule with right hilar and bilateral mediastinal lymphadenopathy. Bilateral adrenal masses, presumed metastases. 2. Interstitial lung disease. Granulomatous right upper lobe scarring, likely postinfectious. Electronically Signed  By: Monte Fantasia M.D. On: 11/07/2014 05:51     IMPRESSION:      The patient has been experiencing left-sided numbness from head to feet upon awakening with left-sided headache. CT scan have revealed brain metastases and likely bronchogenic carcinoma. However, biopsy of the lung is needed before the patient proceeds with radiation treatment.   PLAN: We discussed the possible side effects and risks of treatment in addition to the possible benefits of treatment. All of the patient's questions were answered.   A biopsy will be scheduled. I will follow up on final pathology results and anticipate radiation will be needed.   We will schedule simulation for treatment planning later this week.   I anticipate a two week course of treatment of whole brain radiation if malignancy is confirmed.   He will likely benefit from concurrent treatment to the chest given central disease. Both areas will receive 30 Gy in 10 fractions, and this 10 be completed as an outpatient after the patient is suitable for discharge.    ________________________________   Jodelle Gross, MD, PhD   **Disclaimer: This note was dictated with voice recognition software. Similar sounding words can inadvertently be transcribed and this note may contain transcription errors which may not have been corrected upon publication of note.**  This document serves as a record of services personally performed by Kyung Rudd, MD. It was created on his behalf by Derek Mound, a  trained medical scribe. The creation of this record is based on the scribe's personal observations and the provider's statements to them. This document has been checked and approved by the attending provider.

## 2014-11-07 NOTE — ED Notes (Signed)
Patient transported to CT 

## 2014-11-07 NOTE — Progress Notes (Signed)
Initial Nutrition Assessment  DOCUMENTATION CODES:   Severe malnutrition in context of chronic illness, Severe malnutrition in context of social or environmental circumstances  INTERVENTION:  - Will order supplements with diet advancement per SLP recommendation - RD will continue to monitor for needs  NUTRITION DIAGNOSIS:   Inadequate oral intake related to inability to eat as evidenced by NPO status.  GOAL:   Patient will meet greater than or equal to 90% of their needs  MONITOR:   Diet advancement, Weight trends, Labs, I & O's, Skin  REASON FOR ASSESSMENT:   Consult Assessment of nutrition requirement/status  ASSESSMENT:   69 yo male, smoker, 1 ppd for 30+ yrs, drinks 1-2 beers per day, with no previous known medical conditions, presented to [facility] ED via EMS for evaluation of sudden onset of LUE weakness and numbness that started around 2 am when he woke up to go to the bathroom. This has progressed to involve LLE and he was eventually unable to ambulate independently. Pt reports noticing generalized headaches over the past week, worse on the left side, intermittent and throbbing, 7/10 in severity when present, occasionally but not consistently radiating to the entire head, no specific alleviating or aggravating factors, no specific associated symptoms such as fevers, chills, visual changes.   Pt seen for consult. BMI indicates normal weight status. Pt has been NPO since admission and is pending SLP evaluation for diet texture recommendation. Pt reports he is feeling hungry and denies abdominal pain or nausea this AM.  He states PTA he had a good appetite and typically ate 2 meals/day with snacks in between. He denies chewing or swallowing issues at baseline. Pt reports that he weighed 175 lbs and has unintentionally lost weight since that time. This represents 28 lb weight loss (16% body weight) in 1 year which is not significant for time frame.  Physical assessment indicated  moderate and severe muscle and moderate and severe fat wasting.   Pt unable to meet needs at this time. Will monitor for diet advancement and order appropriate supplements at that time. Needs increased due to MD note this AM indicating pt has brain mets with unknown origin and mass of middle R lobe of lung with concern for bronchogeic carcinoma. Medications reviewed. Labs reviewed.   Diet Order:  Diet NPO time specified  Skin:  Reviewed, no issues  Last BM:  9/25  Height:   Ht Readings from Last 1 Encounters:  11/07/14 '6\' 1"'$  (1.854 m)    Weight:   Wt Readings from Last 1 Encounters:  11/07/14 147 lb (66.679 kg)    Ideal Body Weight:  83.64 kg (kg)  BMI:  Body mass index is 19.4 kg/(m^2).  Estimated Nutritional Needs:   Kcal:  2000-2200  Protein:  70-85 grams  Fluid:  2 L/day  EDUCATION NEEDS:   No education needs identified at this time     Jarome Matin, RD, LDN Inpatient Clinical Dietitian Pager # [phone] After hours/weekend pager # [phone]

## 2014-11-07 NOTE — Consult Note (Signed)
Casey Charles  Telephone:([phone]) [place] NOTE  Casey Charles                                MR#: [id number]  DOB: 06-06-1945                       CSN#: [id number]  Referring MD: Dr. Sheliah [facility]  Primary Provider: no PCP  Reason for Consult:  Presumed Metastatic Lung Cancer   CLE:XNTZGY [name] is a 69 y.o. male with no prior documented medical history, admitted on 9/26 with sudden onset of LUE weakness and numbness, later extending to the LLE. He had also reported 1-week history of progressive headaches without any vision changes, photophobia  or seizures. Due to these symptoms he was evaluated at the ED. No confusion is reported. Denies fevers, chills, night sweats or mucositis. Denies any productive cough or hemoptysis. He does have intermittent cough spells. Denies any chest pain or palpitations. Denies lower extremity swelling. Denies nausea, heartburn or change in bowel habits. Appetite is normal. Denies any dysuria, but does report polyuria. Denies abnormal skin rashes. Denies any bleeding issues such as epistaxis, hematemesis, hematuria or hematochezia. He reports chronic lower back pain, intermittent.Patient is a chronic smoker, at 1ppd for 30+ years. He drinks moderately ETOH. Denies recreational drug use.  CT Head Without Contrast on 9/26/2016showed numerous brain masses most consistent with hemorrhagic metastases. Associated variable vasogenic edema with mild local mass effect was seen.CT Chest, Abdomen Pelvis With Contrast on 11/07/2014 revealed Intrathoracic malignancy, likely bronchogenic carcinoma. There is a right middle lobe pulmonary nodule with right hilar and bilateral mediastinal lymphadenopathy. Bilateral adrenal masses, presumed metastases were seen as well.  IR to evaluate patient for tissue diagnosis. Radiation oncology to consult as well. He has been placed on IV Decadron since admission.We were requested to see  this patient with recommendations.        PMH:  Past Medical History  Diagnosis Date  . Chronic back pain     Surgeries:  History reviewed. No pertinent past surgical history.  Allergies: No Known Allergies  Medications:   Scheduled Meds: . dexamethasone  4 mg Intravenous 6 times per day  . sodium chloride  3 mL Intravenous Q12H   Continuous Infusions:  PRN Meds:.albuterol, hydrALAZINE, HYDROmorphone (DILAUDID) injection, ondansetron **OR** ondansetron (ZOFRAN) IV  FVC:BSWHQPRFF, hydrALAZINE, HYDROmorphone (DILAUDID) injection  ROS: Constitutional: Denies fevers, chills or abnormal night sweats Eyes: Denies blurriness of vision, double vision or watery eyes Ears, nose, mouth, throat, and face: Denies mucositis or sore throat Respiratory: Denies acute cough, dyspnea or wheezes Cardiovascular: Denies palpitation, chest discomfort or lower extremity swelling Gastrointestinal:  Denies nausea, heartburn or change in bowel habits Skin: Denies abnormal skin rashes Lymphatics: Denies new lymphadenopathy or easy bruising Neurological: As per HPI Behavioral/Psych: Mood is stable, no new changes  All other systems were reviewed with the patient and are negative.   Family History:    History reviewed. No pertinent family history.  May have had one paternal grandfather with lung cancer, but cannot elaborate   Social History:  reports that he has been smoking Cigarettes.  He has been smoking about 1.00 pack per day. He does not have any smokeless tobacco history on file. He reports that he drinks alcohol. He reports that he does not use illicit drugs. Lives in [place]. 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 No children.   Physical Exam     Filed Vitals:   11/07/14 0900  BP: 168/61  Pulse: 55  Temp: 97.8 F (36.6 C)  Resp: 18   Filed Weights   11/07/14 0329  Weight: 147 lb (66.679 kg)    GENERAL: alert, uncomfortable due to left sided weakness and headaches.  Temporal wasting noted. Cachectic looking SKIN: skin color, texture, turgor are normal, no rashes or significant lesions EYES: normal, conjunctiva are pink and non-injected, sclera opaque OROPHARYNX: mild white coating noted, no erythema and lips, buccal mucosa, and tongue normal  NECK: supple, thyroid normal size, non-tender, without nodularity LYMPH:  no palpable lymphadenopathy in the cervical, axillary or inguinal LUNGS: clear to auscultation and percussion with normal breathing effort HEART: regular rate & rhythm and no murmurs and no lower extremity edema ABDOMEN:abdomen soft, non-tender and normal bowel sounds Musculoskeletal:no cyanosis of digits; clubbing noted. PSYCH: alert & oriented x 3 with fluent speech NEURO: remarkable for left sided weakness and numbness, slight dysarthria  CBC  Recent Labs Lab 11/07/14 0233  WBC 4.4  HGB 13.7  HCT 40.5  PLT 184  MCV 92.9  MCH 31.4  MCHC 33.8  RDW 12.7  LYMPHSABS 1.3  MONOABS 0.5  EOSABS 0.1  BASOSABS 0.0     CMP    Recent Labs Lab 11/07/14 0233  NA 134*  K 3.5  CL 106  CO2 22  GLUCOSE 94  BUN 16  CREATININE 0.87  CALCIUM 8.9        Recent Labs Lab 11/07/14 0233  INR 1.20    Imaging Studies:  Dg Chest 2 View  11/07/2014   CLINICAL DATA:  Abnormal head CT. Smoking history. Evaluate for cancer.  EXAM: CHEST  2 VIEW  COMPARISON:  08/26/2014  FINDINGS: Right middle lobe pulmonary nodule, most discrete in the lateral projection and measuring 16 mm.  Borderline cardiomegaly. Saber trachea correlating with COPD changes. Hyperinflation, apical hyperinflation, and diffuse interstitial coarsening. Focal scarring with calcification at the right apex.  IMPRESSION: 1. Right middle lobe pulmonary nodule, correlate with pending chest CT. 2. Chronic lung disease including COPD.   Electronically Signed   By: Monte Fantasia M.D.   On: 11/07/2014 05:20   Ct Head Wo Contrast  11/07/2014   COMPARISON:  02/29/2012   FINDINGS: Skull and Sinuses:Negative for fracture or destructive process. The mastoids, middle ears, and imaged paranasal sinuses are clear.  Orbits: No acute abnormality.  Brain: Multiple high-density masses, at least 11 in the right cerebral hemisphere, 19 in the left cerebral hemisphere, and 6 infratentorial (including a 19 mm mass in the lower pons). Variable degrees of surrounding edema with local mass effect but no shift or herniation. No subarachnoid or intraventricular hemorrhage. No evidence of acute infarct. There is a background of atrophy and chronic small vessel disease.  Critical Value/emergent results were called by telephone at the time of interpretation on 11/07/2014 at 3:38 am to Dr. Rolland Porter , who verbally acknowledged these results.  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 Associated variable vasogenic edema with mild local mass effect.   Electronically Signed   By: Monte Fantasia M.D.   On: 11/07/2014 03:44   Ct Chest W Contrast Ct Abdomen Pelvis W Contrast  11/07/2014  COMPARISON:  None.  FINDINGS:  CT CHEST FINDINGS  THORACIC INLET/BODY WALL:  No acute abnormality.  MEDIASTINUM:  Normal heart size. No pericardial effusion. Diffuse atherosclerosis, including the coronary arteries.  Right upper paratracheal, left lower paratracheal, subcarinal, and right hilar lymphadenopathy with central low-density necrotic appearance. The largest node is sub- carinal, 4 cm in diameter. Presumed primary in the right middle lobe measuring 24 mm in diameter. No additional suspicious pulmonary nodules.  LUNG WINDOWS:  Reticulation the lower lungs which is unusual, favoring an interstitial lung disease without honeycombing. Extensive right upper lobe scarring and volume loss with airway obstruction and bronchiectasis. Multiple calcified granulomas.  Saber trachea and hyperinflation.  OSSEOUS:  No acute fracture.  No suspicious lytic or blastic lesions.   CT ABDOMEN  PELVIS FINDINGS  Hepatobiliary: No focal liver abnormality.No evidence of biliary obstruction or stone.  Pancreas: Unremarkable.  Spleen: Unremarkable.  Adrenals/Urinary Tract: Bilateral adrenal nodules, heterogeneous in appearance and measuring up to 25 mm on the right.  No hydronephrosis or stone.  Distended and thick walled bladder, likely chronic outlet obstruction.  Reproductive:Prostatomegaly, projecting into the bladder base.  Stomach/Bowel:  No obstruction. No appendicitis.  Vascular/Lymphatic: No acute vascular abnormality. No definitive malignant adenopathy.  Peritoneal: No ascites or pneumoperitoneum.  Musculoskeletal: No acute abnormalities. Advanced lower lumbar degenerative disc and facet disease.   IMPRESSION: 1. Intrathoracic malignancy, likely bronchogenic carcinoma. There is a right middle lobe pulmonary nodule with right hilar and bilateral mediastinal lymphadenopathy. Bilateral adrenal masses, presumed metastases. 2. Interstitial lung disease. Granulomatous right upper lobe scarring, likely postinfectious.   Electronically Signed   By: Monte Fantasia M.D.   On: 11/07/2014 05:51     Assessment/Plan: 69 y.o.   Metastatic Brain Lesions Metastatic Lung lesions These are new findings Etiology unknown to date, suspected lung primary due to history of tobacco abuse CT Head Without Contrast on 9/26/2016showed numerous brain masses most consistent with hemorrhagic metastases. Associated variable vasogenic edema with mild local mass effect was seen.  CT Chest, Abdomen Pelvis With Contrast on 11/07/2014 revealed Intrathoracic malignancy, likely bronchogenic carcinoma. There is a right middle lobe pulmonary nodule with right hilar and bilateral mediastinal lymphadenopathy. Bilateral adrenal masses, presumed metastases were seen as well.  Continue IV Decadron and seizure precautions Tobacco cessation recommended IR consulted to obtain tissue diagnosis, but recommended PCCM to perform  bronch instead. Neurosurgery to see Recommend Radiation Oncology consultation  For possible radiation to hemorrhagic brain masses Will need PET as outpatient  Palliative care consult is pending.   Acute Left sided weakness This is due to hemorrhagic brain masses Continue Decadron  Recommend Rad Onc consult  Hyponatremia, mild This is likely due to malignancy Appreciate primary team's management  Back pain, acute on chronic Will consider imaging to rule out occult metastatic disease Continue Decadron and pain meds with bowel support.   DVT prophylaxis On mechanical devices.  Malnutrition Nutritionist to see  Full code   Other medical issues as per admitting team     [place], PA-C 11/07/2014 10:32 AM  Attending addendum  I have seen the patient, examined him. I agree with the assessment and and plan and have edited the notes.   69 year old African-American male, with heavy smoking history, presented with left-sided weakness. CT scan reviewed a right middle lobe lung lesion, right hilar and BX done on adenopathy, bilateral adrenal and diffuse brain metastasis. Image findings are most consistent with metastatic lung cancer.  I reviewed the above findings with patient. He is slightly aphasic, but oriented and coherent, and wish to pursue diagnostic workup and treatment. We need tissue diagnosis, I spoke with interventional radiologist Dr. Vernard Gambles, who recommend tissue biopsy through bronchoscopy and keep [place].  He was seen by pulmonologist Dr. Kimber Relic who will arrange the above biopsy procedure. Hopefully we'll get enough tissue for molecule or testing, such as [facility] One if this is adnocarcinoma.   He would likely need whole brain radiation, he was seen by radiation oncologist Dr. Lisbeth Renshaw this afternoon, and I spoke with Dr. Lisbeth Renshaw.  Regarding his social support, I spoke with his mother on the phone, who is 53. He lives with his mother sometime, and sometime with his girl  friend. He does not have children. He has 3 brothers, but his mother does not think his brother will be his caregiver. Patient mentioned he can find somebody to bring him in for treatment.  I will see him in my clinic for follow up in 1-2 weeks, I will arrange that.   Thanks for the opportunity to participate distant metastases care. Please call me if you have questions.  Truitt Merle  11/07/2014

## 2014-11-07 NOTE — ED Notes (Signed)
Spoke with Dr. Doyle Askew who stated that pt is medically okay to go to med-surg floor even if they continue with neuro checks.

## 2014-11-07 NOTE — ED Notes (Signed)
Pleas Patricia ( pt's mother)- [phone]

## 2014-11-07 NOTE — ED Notes (Signed)
Patient transported to X-ray 

## 2014-11-07 NOTE — Evaluation (Signed)
Clinical/Bedside Swallow Evaluation Patient Details  Name: SABAN HEINLEN MRN: [id number] Date of Birth: 21-May-1945  Today's Date: 11/07/2014 Time: SLP Start Time (ACUTE ONLY): 1340 SLP Stop Time (ACUTE ONLY): 1400 SLP Time Calculation (min) (ACUTE ONLY): 20 min  Past Medical History:  Past Medical History  Diagnosis Date  . Chronic back pain    Past Surgical History: History reviewed. No pertinent past surgical history. HPI:  69 yo male adm to [facility] with progressive left sided weakness/numbness over one week.  PMH + smoker, 1 ppd for 30+ yrs, drinks 1-2 beers per day, with no previous known medical conditions.  Pt reports noticing generalized headaches over the past week, worse on the left side, intermittent and throbbing, 7/10 in severity when present, occasionally but not consistently radiating to the entire head, no specific alleviating or aggravating factors, no specific associated symptoms such as fevers, chills, visual changes. Patient reports having chronic lower and upper back pain, somewhat worse over the past week, constant, 5/5 in severity, with no specific alleviating factors including with use of Tylenol that he has been taking at home. Patient also denies chest pain or shortness of breath, no specific abdominal or urinary concerns.  Pt found to have probable lung masses with brain mets.  Swallow evaluation ordered.    Assessment / Plan / Recommendation Clinical Impression  Pt presents with functional oropharyngeal swallow ability - no symptoms of dysphagia or airway compromise. He does demonstrate mildly delayed oral transiting - but no holding or oral pocketing.  Decreased facial symmetry on left and sensory deficits minimal.  Recommend advance diet to regular/thin.   Recommend PT/OT for left sided numbness/weakness.      Aspiration Risk  Mild    Diet Recommendation Age appropriate regular solids;Thin   Medication Administration: Whole meds with liquid Compensations: Slow  rate;Small sips/bites    Other  Recommendations Oral Care Recommendations: Oral care BID   Follow Up Recommendations    n/a   Frequency and Duration     n/a   Pertinent Vitals/Pain Afebrile, decreased       Swallow Study Prior Functional Status   see hhx    General Date of Onset: 11/07/14 Other Pertinent Information: 69 yo male adm to [facility] with progressive left sided weakness/numbness over one week.  PMH + smoker, 1 ppd for 30+ yrs, drinks 1-2 beers per day, with no previous known medical conditions.  Pt reports noticing generalized headaches over the past week, worse on the left side, intermittent and throbbing, 7/10 in severity when present, occasionally but not consistently radiating to the entire head, no specific alleviating or aggravating factors, no specific associated symptoms such as fevers, chills, visual changes. Patient reports having chronic lower and upper back pain, somewhat worse over the past week, constant, 5/5 in severity, with no specific alleviating factors including with use of Tylenol that he has been taking at home. Patient also denies chest pain or shortness of breath, no specific abdominal or urinary concerns.  Pt found to have probable lung masses with brain mets.  Swallow evaluation ordered.  Type of Study: Bedside swallow evaluation Diet Prior to this Study: NPO Temperature Spikes Noted: No Respiratory Status: Room air History of Recent Intubation: No Behavior/Cognition: Alert;Cooperative;Pleasant mood (flat affect) Oral Cavity - Dentition: Adequate natural dentition/normal for age Self-Feeding Abilities: Able to feed self Patient Positioning: Upright in bed Baseline Vocal Quality: Normal Volitional Cough: Strong Volitional Swallow: Able to elicit    Oral/Motor/Sensory Function Overall Oral Motor/Sensory Function: Impaired (left  facial asymmetry - mild, left facial sensory deficit)   Ice Chips Ice chips: Within functional limits Presentation: Spoon    Thin Liquid Thin Liquid: Within functional limits Presentation: Straw;Self Fed;Cup    Nectar Thick Nectar Thick Liquid: Not tested   Honey Thick Honey Thick Liquid: Not tested   Puree Puree: Within functional limits Presentation: Self Fed;Spoon   Solid   GO    Solid: Within functional limits Presentation: [place], [place] [facility] SLP [phone]

## 2014-11-08 DIAGNOSIS — R339 Retention of urine, unspecified: Secondary | ICD-10-CM | POA: Diagnosis not present

## 2014-11-08 DIAGNOSIS — C342 Malignant neoplasm of middle lobe, bronchus or lung: Secondary | ICD-10-CM | POA: Diagnosis present

## 2014-11-08 DIAGNOSIS — E43 Unspecified severe protein-calorie malnutrition: Secondary | ICD-10-CM

## 2014-11-08 DIAGNOSIS — Z681 Body mass index (BMI) 19 or less, adult: Secondary | ICD-10-CM | POA: Diagnosis not present

## 2014-11-08 DIAGNOSIS — C7931 Secondary malignant neoplasm of brain: Secondary | ICD-10-CM | POA: Diagnosis present

## 2014-11-08 DIAGNOSIS — J984 Other disorders of lung: Secondary | ICD-10-CM | POA: Diagnosis not present

## 2014-11-08 DIAGNOSIS — F1010 Alcohol abuse, uncomplicated: Secondary | ICD-10-CM

## 2014-11-08 DIAGNOSIS — Z7189 Other specified counseling: Secondary | ICD-10-CM | POA: Diagnosis not present

## 2014-11-08 DIAGNOSIS — Z510 Encounter for antineoplastic radiation therapy: Secondary | ICD-10-CM | POA: Diagnosis present

## 2014-11-08 DIAGNOSIS — T380X5A Adverse effect of glucocorticoids and synthetic analogues, initial encounter: Secondary | ICD-10-CM | POA: Diagnosis not present

## 2014-11-08 DIAGNOSIS — G8929 Other chronic pain: Secondary | ICD-10-CM | POA: Diagnosis present

## 2014-11-08 DIAGNOSIS — C7800 Secondary malignant neoplasm of unspecified lung: Secondary | ICD-10-CM | POA: Diagnosis present

## 2014-11-08 DIAGNOSIS — R739 Hyperglycemia, unspecified: Secondary | ICD-10-CM | POA: Diagnosis not present

## 2014-11-08 DIAGNOSIS — C801 Malignant (primary) neoplasm, unspecified: Secondary | ICD-10-CM | POA: Diagnosis present

## 2014-11-08 DIAGNOSIS — R531 Weakness: Secondary | ICD-10-CM | POA: Diagnosis present

## 2014-11-08 DIAGNOSIS — Z515 Encounter for palliative care: Secondary | ICD-10-CM | POA: Diagnosis not present

## 2014-11-08 DIAGNOSIS — C799 Secondary malignant neoplasm of unspecified site: Secondary | ICD-10-CM

## 2014-11-08 DIAGNOSIS — M6281 Muscle weakness (generalized): Secondary | ICD-10-CM | POA: Diagnosis present

## 2014-11-08 DIAGNOSIS — R001 Bradycardia, unspecified: Secondary | ICD-10-CM | POA: Diagnosis present

## 2014-11-08 DIAGNOSIS — M6289 Other specified disorders of muscle: Secondary | ICD-10-CM | POA: Diagnosis not present

## 2014-11-08 DIAGNOSIS — E279 Disorder of adrenal gland, unspecified: Secondary | ICD-10-CM | POA: Diagnosis present

## 2014-11-08 DIAGNOSIS — R599 Enlarged lymph nodes, unspecified: Secondary | ICD-10-CM | POA: Diagnosis not present

## 2014-11-08 DIAGNOSIS — R911 Solitary pulmonary nodule: Secondary | ICD-10-CM | POA: Diagnosis not present

## 2014-11-08 DIAGNOSIS — C3411 Malignant neoplasm of upper lobe, right bronchus or lung: Secondary | ICD-10-CM | POA: Diagnosis not present

## 2014-11-08 DIAGNOSIS — F17210 Nicotine dependence, cigarettes, uncomplicated: Secondary | ICD-10-CM | POA: Diagnosis present

## 2014-11-08 DIAGNOSIS — C3490 Malignant neoplasm of unspecified part of unspecified bronchus or lung: Secondary | ICD-10-CM | POA: Diagnosis not present

## 2014-11-08 DIAGNOSIS — G936 Cerebral edema: Secondary | ICD-10-CM | POA: Diagnosis present

## 2014-11-08 DIAGNOSIS — J849 Interstitial pulmonary disease, unspecified: Secondary | ICD-10-CM | POA: Diagnosis present

## 2014-11-08 DIAGNOSIS — C7970 Secondary malignant neoplasm of unspecified adrenal gland: Secondary | ICD-10-CM | POA: Diagnosis present

## 2014-11-08 DIAGNOSIS — C3491 Malignant neoplasm of unspecified part of right bronchus or lung: Secondary | ICD-10-CM

## 2014-11-08 DIAGNOSIS — I10 Essential (primary) hypertension: Secondary | ICD-10-CM | POA: Diagnosis present

## 2014-11-08 DIAGNOSIS — J449 Chronic obstructive pulmonary disease, unspecified: Secondary | ICD-10-CM | POA: Diagnosis not present

## 2014-11-08 DIAGNOSIS — M545 Low back pain: Secondary | ICD-10-CM | POA: Diagnosis present

## 2014-11-08 DIAGNOSIS — E871 Hypo-osmolality and hyponatremia: Secondary | ICD-10-CM | POA: Diagnosis present

## 2014-11-08 DIAGNOSIS — G9340 Encephalopathy, unspecified: Secondary | ICD-10-CM | POA: Diagnosis present

## 2014-11-08 DIAGNOSIS — R200 Anesthesia of skin: Secondary | ICD-10-CM | POA: Diagnosis present

## 2014-11-08 LAB — BASIC METABOLIC PANEL
ANION GAP: 11 (ref 5–15)
BUN: 15 mg/dL (ref 6–20)
CO2: 23 mmol/L (ref 22–32)
Calcium: 10.1 mg/dL (ref 8.9–10.3)
Chloride: 100 mmol/L — ABNORMAL LOW (ref 101–111)
Creatinine, Ser: 0.79 mg/dL (ref 0.61–1.24)
Glucose, Bld: 151 mg/dL — ABNORMAL HIGH (ref 65–99)
POTASSIUM: 4.1 mmol/L (ref 3.5–5.1)
SODIUM: 134 mmol/L — AB (ref 135–145)

## 2014-11-08 LAB — CBC
HEMATOCRIT: 45.7 % (ref 39.0–52.0)
HEMOGLOBIN: 15.7 g/dL (ref 13.0–17.0)
MCH: 31 pg (ref 26.0–34.0)
MCHC: 34.4 g/dL (ref 30.0–36.0)
MCV: 90.3 fL (ref 78.0–100.0)
Platelets: 246 10*3/uL (ref 150–400)
RBC: 5.06 MIL/uL (ref 4.22–5.81)
RDW: 12.6 % (ref 11.5–15.5)
WBC: 5.2 10*3/uL (ref 4.0–10.5)

## 2014-11-08 LAB — GLUCOSE, CAPILLARY
GLUCOSE-CAPILLARY: 168 mg/dL — AB (ref 65–99)
GLUCOSE-CAPILLARY: 143 mg/dL — AB (ref 65–99)
Glucose-Capillary: 119 mg/dL — ABNORMAL HIGH (ref 65–99)

## 2014-11-08 MED ORDER — INSULIN ASPART 100 UNIT/ML ~~LOC~~ SOLN
0.0000 [IU] | Freq: Three times a day (TID) | SUBCUTANEOUS | Status: DC
  Administered 2014-11-08: 2 [IU] via SUBCUTANEOUS
  Administered 2014-11-09 – 2014-11-12 (×7): 1 [IU] via SUBCUTANEOUS
  Administered 2014-11-13: 3 [IU] via SUBCUTANEOUS
  Administered 2014-11-13: 1 [IU] via SUBCUTANEOUS
  Administered 2014-11-13: 2 [IU] via SUBCUTANEOUS
  Administered 2014-11-14 (×3): 1 [IU] via SUBCUTANEOUS

## 2014-11-08 MED ORDER — THIAMINE HCL 100 MG/ML IJ SOLN
100.0000 mg | Freq: Every day | INTRAMUSCULAR | Status: DC
  Filled 2014-11-08 (×7): qty 1

## 2014-11-08 MED ORDER — FOLIC ACID 1 MG PO TABS
1.0000 mg | ORAL_TABLET | Freq: Every day | ORAL | Status: DC
  Administered 2014-11-08 – 2014-11-14 (×6): 1 mg via ORAL
  Filled 2014-11-08 (×7): qty 1

## 2014-11-08 MED ORDER — LORAZEPAM 1 MG PO TABS
1.0000 mg | ORAL_TABLET | Freq: Four times a day (QID) | ORAL | Status: AC | PRN
  Administered 2014-11-10: 1 mg via ORAL
  Filled 2014-11-08: qty 1

## 2014-11-08 MED ORDER — VITAMIN B-1 100 MG PO TABS
100.0000 mg | ORAL_TABLET | Freq: Every day | ORAL | Status: DC
  Administered 2014-11-08 – 2014-11-14 (×6): 100 mg via ORAL
  Filled 2014-11-08 (×7): qty 1

## 2014-11-08 MED ORDER — ADULT MULTIVITAMIN W/MINERALS CH
1.0000 | ORAL_TABLET | Freq: Every day | ORAL | Status: DC
  Administered 2014-11-08 – 2014-11-14 (×6): 1 via ORAL
  Filled 2014-11-08 (×7): qty 1

## 2014-11-08 MED ORDER — LORAZEPAM 2 MG/ML IJ SOLN: 1.0000 mg | Freq: Four times a day (QID) | INTRAMUSCULAR | Status: AC | PRN

## 2014-11-08 NOTE — Consult Note (Signed)
Consultation Note Date: 11/08/2014   Patient Name: Casey Charles  DOB: 11/07/1945  MRN: [id number]  Age / Sex: 69 y.o., male   PCP: No primary care provider on file. Referring Physician: Louellen Molder, MD  Reason for Consultation: Establishing goals of care and Psychosocial/spiritual support  Palliative Care Assessment and Plan Summary of Established Goals of Care and Medical Treatment Preferences    Palliative Care Discussion Held Today:    This NP Wadie Lessen reviewed medical records, received report from team, assessed the patient and then meet at the patient's bedside along with his mother Casey Charles and sister Casey Charles to discuss diagnosis, prognosis, GOC, EOL wishes disposition and options.  A detailed discussion was had today regarding advanced directives.  Concepts specific to code status, artifical feeding and hydration, continued IV antibiotics and rehospitalization was had.  The difference between a aggressive medical intervention path  and a palliative comfort care path for this patient at this time was had.  Values and goals of care important to patient and family were attempted to be elicited.  Concept of Hospice and Palliative Care were discussed   Questions and concerns addressed.  Family encouraged to call with questions or concerns.  PMT will continue to support holistically.  Family present are very much concerned the the psych-social burdens.  His mother cannot care for him at home and there is great concern that  with or without medical interventions patient will have increase weakness and care needs.   Primary Decision Maker: self at this time, encouraged to document HPOA  Goals of Care/Code Status/Advance Care Planning:   Code Status: Full code At this time patiet is open to all offered and available medical interventions to prolong life.  He awaits lung biopsy and further input from oncology and radiation on possible treatment  options   Psycho-social/Spiritual:   Support System: limited, mother who is elderly and ill herself and a sister   Desire for further Chaplaincy support:yes   Discharge Planning:  [place] for rehab with Palliative care service follow-up       Chief Complaint:  Left sided weakness and numbness  History of Present Illness:  69 yo male, smoker, 1 ppd for 30+ yrs, drinks 1-2 beers per day (per family this is greater), with no previous known medical conditions, (per family never goes to doctors) presented to [facility] ED via EMS for evaluation of sudden onset of LUE weakness and numbness that started around 2 am when he woke up to go to the bathroom. This has progressed to involve LLE and he was eventually unable to ambulate independently. Pt reports noticing generalized headaches over the past week, worse on the left side, intermittent and throbbing, 7/10 in severity when present, occasionally but not consistently radiating to the entire head, no specific alleviating or aggravating factors, no specific associated symptoms such as fevers, chills, visual changes.   Patient reports having chronic lower and upper back pain, somewhat worse over the past week, constant, 5/5 in severity, with no specific alleviating factors including with use of Tylenol that he has been taking at home. Patient also denies chest pain or shortness of breath, no specific abdominal or urinary concerns. Patient denies similar events in the past. Please note that patient is somewhat poor historian at the time of the admission due to distress from left sided weakness.  In emergency department, vital signs notable for heart rate 55 bpm, otherwise unremarkable. Blood work notable for sodium 134, otherwise normal. Imaging  studies notable for chronic lung disease, COPD with right middle lobe pulmonary nodule. CT chest confirming a nodule with concern of bronchogenic carcinoma. CT head notable for numerous hemorrhagic  masses consistent with metastatic disease.   Patient is faced with treatment option decisions and anticipatory care needs.    Primary Diagnoses  Present on Admission:  . (Resolved) Lung nodule . Acute encephalopathy . 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 Acute hyponatremia . Accelerated hypertension . Bradycardia . COPD (chronic obstructive pulmonary disease) . Current smoker . Acute left-sided weakness, LUE> LLE  Palliative Review of Systems:    -poor appetite, weight loss, left sided numbness and weakness   I have reviewed the medical record, interviewed the patient and family, and examined the patient. The following aspects are pertinent.  Past Medical History  Diagnosis Date  . Chronic back pain    Social History   Social History  . Marital Status: Single    Spouse Name: N/A  . Number of Children: N/A  . Years of Education: N/A   Social History Main Topics  . Smoking status: Current Every Day Smoker -- 1.00 packs/day    Types: Cigarettes  . Smokeless tobacco: None  . Alcohol Use: Yes     Comment: daily  . Drug Use: No  . Sexual Activity: Yes   Other Topics Concern  . None   Social History Narrative   History reviewed. No pertinent family history. Scheduled Meds: . dexamethasone  4 mg Intravenous 6 times per day  . folic acid  1 mg Oral Daily  . insulin aspart  0-9 Units Subcutaneous TID WC  . multivitamin with minerals  1 tablet Oral Daily  . nicotine  21 mg Transdermal Daily  . sodium chloride  3 mL Intravenous Q12H  . thiamine  100 mg Oral Daily   Or  . thiamine  100 mg Intravenous Daily   Continuous Infusions:  PRN Meds:.albuterol, hydrALAZINE, HYDROmorphone (DILAUDID) injection, LORazepam **OR** LORazepam, ondansetron **OR** ondansetron (ZOFRAN) IV Medications Prior to Admission:  Prior to Admission medications   Medication Sig Start Date End Date Taking? Authorizing Provider  acetaminophen (PAIN  RELIEF) 325 MG tablet Take 650 mg by mouth every 6 (six) hours as needed for moderate pain.   Yes Historical Provider, MD  Multiple Vitamin (MULTIVITAMIN WITH MINERALS) TABS tablet Take 1 tablet by mouth daily.   Yes Historical Provider, MD  azithromycin (ZITHROMAX) 250 MG tablet Take 1 tablet (250 mg total) by mouth daily. Take first 2 tablets together, then 1 every day until finished. Patient not taking: Reported on 11/07/2014 08/26/14   Larene Pickett, PA-C  cyclobenzaprine (FLEXERIL) 10 MG tablet Take 1 tablet (10 mg total) by mouth 3 (three) times daily as needed for muscle spasms. Patient not taking: Reported on 08/26/2014 10/07/12   Hazel Sams, PA-C  predniSONE (DELTASONE) 20 MG tablet Take 2 tablets (40 mg total) by mouth daily. Take 40 mg by mouth daily for 3 days, then '20mg'$  by mouth daily for 3 days, then '10mg'$  daily for 3 days Patient not taking: Reported on 11/07/2014 08/26/14   Larene Pickett, PA-C  traMADol (ULTRAM) 50 MG tablet Take 1 tablet (50 mg total) by mouth every 6 (six) hours as needed for pain. Patient not taking: Reported on 08/26/2014 10/07/12   Hazel Sams, PA-C   No Known Allergies CBC:    Component Value Date/Time   WBC 5.2 11/08/2014 0555   HGB 15.7 11/08/2014  0555   HCT 45.7 11/08/2014 0555   PLT 246 11/08/2014 0555   MCV 90.3 11/08/2014 0555   NEUTROABS 2.5 11/07/2014 0233   LYMPHSABS 1.3 11/07/2014 0233   MONOABS 0.5 11/07/2014 0233   EOSABS 0.1 11/07/2014 0233   BASOSABS 0.0 11/07/2014 0233   Comprehensive Metabolic Panel:    Component Value Date/Time   NA 134* 11/08/2014 0555   K 4.1 11/08/2014 0555   CL 100* 11/08/2014 0555   CO2 23 11/08/2014 0555   BUN 15 11/08/2014 0555   CREATININE 0.79 11/08/2014 0555   GLUCOSE 151* 11/08/2014 0555   CALCIUM 10.1 11/08/2014 0555    Physical Exam:  Vital Signs: BP 148/69 mmHg  Pulse 78  Temp(Src) 97.8 F (36.6 C) (Axillary)  Resp 18  Ht '6\' 1"'$  (1.854 m)  Wt 66.679 kg (147 lb)  BMI 19.40 kg/m2  SpO2  99% SpO2: SpO2: 99 % O2 Device: O2 Device: Not Delivered O2 Flow Rate:   Intake/output summary:  Intake/Output Summary (Last 24 hours) at 11/08/14 1255 Last data filed at 11/07/14 2305  Gross per 24 hour  Intake      0 ml  Output    600 ml  Net   -600 ml   LBM: Last BM Date: 11/06/14 Baseline Weight: Weight: 66.679 kg (147 lb) Most recent weight: Weight: 66.679 kg (147 lb)  Exam Findings:   General: chronically ill appearing, NAD HEENT: moist buccal membranes, no exudate  CVS: RRR Resp: CTA Skin: warm and dry Neuro: left sided weakness           Palliative Performance Scale: 30 %                Additional Data Reviewed: Recent Labs     11/07/14  0233  11/08/14  0555  WBC  4.4  5.2  HGB  13.7  15.7  PLT  184  246  NA  134*  134*  BUN  16  15  CREATININE  0.87  0.79     Time In: 0945 Time Out: 1045 Time Total: 75 min  Greater than 50%  of this time was spent counseling and coordinating care related to the above assessment and plan.  Discussed with  Dr  Clementeen Graham  Signed by: Wadie Lessen, NP  Knox Royalty, NP  11/08/2014, 12:55 PM  Please contact Palliative Medicine Team phone at [phone] for questions and concerns.   See AMION for contact information

## 2014-11-08 NOTE — Progress Notes (Signed)
[facility] PROGRESS NOTE  Casey Charles SNK:[id number] DOB: 06-30-1945 DOA: 11/07/2014 PCP: No primary care [name] on file. Brief narrative 70 year old male with ongoing tobacco use (over 30-pack-year smoking history), ongoing alcohol use 2-4 beers/ day, no prior medical history presented to the ED with sudden onset of left upper extremity weakness and numbness earlier on the day of admission when he woke up to go to the bathroom. This progressed to involve his lower leg and was unable to ambulate. He complained of generalized headaches over the past 1 week worse on the left side, intermittent without any aggravating or relieving factors. Denies any visual symptoms. Reports worsening back pain over the past 1 week as well. In the ED imaging done showed COPD changes with a right middle lobe pulmonary nodule. Chest CT further confirmed right middle lobe nodule concerning for bronchogenic carcinoma. Head CT was done with numerous hemorrhagic masses consistent with metastatic disease. Patient admitted to hospitalist service. Oncology and pulmonary consulted.  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 Plan on endobronchial biopsy for tissue diagnosis possibly later this week. Started on Decadron for variable with significant edema with mild local mass effect. Appreciate oncology and radiation oncology recommendation. Plan on oral brain radiation once biopsy obtain. Oncology and radiation oncology follow patient in possibly 2 weeks as outpatient. Patient will need a PET scan as well. CT abdomen and pelvis showed bilateral adrenal nodules. -Continue neuro checks. Maintain seizure precautions. Left extremity weakness appears to have improved since admission (based upon description on admission) -PT / OT evaluation. Pain control and bowel regimen.  Alcohol abuse Placed on CIWA. Continue thiamine, folate and multivitamin.  Tobacco  abuse Counseled on cessation. Nicotine patch placed.  Protein calorie mentation Nutrition consult  Elevated blood glucose Secondary to Decadron. Monitor with sliding scale coverage.  DVT prophylaxis: SCDs (given multiple hemorrhagic metastasis)  diet: Regular (clear by swallow evaluation)  Code Status: Full code Family Communication: None at bedside. Lives with his mother who is 1 years old. Will attempt to contact her Disposition Plan: Continue inpatient for now.   Consultants:  Oncology  Radiation oncology  Pulmonary  Procedures:  Head CT  CT chest and abdomen  Antibiotics:  None  HPI/Subjective: Seen and examined. Reports some improvement in his left upper extremity./Some numbness in his left leg and weakness.  Objective: Filed Vitals:   11/08/14 0512  BP: 148/69  Pulse: 78  Temp: 97.8 F (36.6 C)  Resp: 18    Intake/Output Summary (Last 24 hours) at 11/08/14 1149 Last data filed at 11/07/14 2305  Gross per 24 hour  Intake      0 ml  Output    600 ml  Net   -600 ml   Filed Weights   11/07/14 0329  Weight: 66.679 kg (147 lb)    Exam:   General: Elderly thin built male in no acute distress  HEENT: No pallor, or dentition, moist mucosa, no icterus, supple neck  Chest: Clear to auscultation bilaterally, no added sounds  CVS: Normal S1 and S2 no murmurs rub or gallop  GI: Soft, nondistended, nontender, bowel sounds present  Musculoskeletal: Warm, no edema  CNS: Alert and oriented, mild left facial droop, 4+/5 power in the left upper extremity with some diminished sensation in the hand, 4/5 part in the left leg with diminished sensation, gait not assessed    Data Reviewed: Basic Metabolic Panel:  Recent Labs Lab 11/07/14 0233 11/08/14 0555  NA 134* 134*  K 3.5 4.1  CL 106 100*  CO2 22 23  GLUCOSE 94 151*  BUN 16 15  CREATININE 0.87 0.79  CALCIUM 8.9 10.1   Liver Function Tests: No results for input(s): AST, ALT, ALKPHOS,  BILITOT, PROT, ALBUMIN in the last 168 hours. No results for input(s): LIPASE, AMYLASE in the last 168 hours. No results for input(s): AMMONIA in the last 168 hours. CBC:  Recent Labs Lab 11/07/14 0233 11/08/14 0555  WBC 4.4 5.2  NEUTROABS 2.5  --   HGB 13.7 15.7  HCT 40.5 45.7  MCV 92.9 90.3  PLT 184 246   Cardiac Enzymes: No results for input(s): CKTOTAL, CKMB, CKMBINDEX, TROPONINI in the last 168 hours. BNP (last 3 results) No results for input(s): BNP in the last 8760 hours.  ProBNP (last 3 results) No results for input(s): PROBNP in the last 8760 hours.  CBG: No results for input(s): GLUCAP in the last 168 hours.  No results found for this or any previous visit (from the past 240 hour(s)).   Studies: Dg Chest 2 View  11/07/2014   CLINICAL DATA:  Abnormal head CT. Smoking history. Evaluate for cancer.  EXAM: CHEST  2 VIEW  COMPARISON:  08/26/2014  FINDINGS: Right middle lobe pulmonary nodule, most discrete in the lateral projection and measuring 16 mm.  Borderline cardiomegaly. Saber trachea correlating with COPD changes. Hyperinflation, apical hyperinflation, and diffuse interstitial coarsening. Focal scarring with calcification at the right apex.  IMPRESSION: 1. Right middle lobe pulmonary nodule, correlate with pending chest CT. 2. Chronic lung disease including COPD.   Electronically Signed   By: Monte Fantasia M.D.   On: 11/07/2014 05:20   Ct Head Wo Contrast  11/07/2014   CLINICAL DATA:  Left-sided numbness from head to feet upon awakening. Left-sided headache.  EXAM: CT HEAD WITHOUT CONTRAST  TECHNIQUE: Contiguous axial images were obtained from the base of the skull through the vertex without intravenous contrast.  COMPARISON:  02/29/2012  FINDINGS: Skull and Sinuses:Negative for fracture or destructive process. The mastoids, middle ears, and imaged paranasal sinuses are clear.  Orbits: No acute abnormality.  Brain: Multiple high-density masses, at least 11 in the right  cerebral hemisphere, 19 in the left cerebral hemisphere, and 6 infratentorial (including a 19 mm mass in the lower pons). Variable degrees of surrounding edema with local mass effect but no shift or herniation. No subarachnoid or intraventricular hemorrhage. No evidence of acute infarct. There is a background of atrophy and chronic small vessel disease.  Critical Value/emergent results were called by telephone at the time of interpretation on 11/07/2014 at 3:38 am to Dr. Rolland Porter , who verbally acknowledged these results.  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 Associated variable vasogenic edema with mild local mass effect.   Electronically Signed   By: Monte Fantasia M.D.   On: 11/07/2014 03:44   Ct Chest W Contrast  11/07/2014   CLINICAL DATA:  Possible metastatic disease.  Brain masses  EXAM: CT CHEST, ABDOMEN, AND PELVIS WITH CONTRAST  TECHNIQUE: Multidetector CT imaging of the chest, abdomen and pelvis was performed following the standard protocol during bolus administration of intravenous contrast.  CONTRAST:  65m OMNIPAQUE IOHEXOL 300 MG/ML SOLN, 1037mOMNIPAQUE IOHEXOL 300 MG/ML SOLN  COMPARISON:  None.  FINDINGS: CT CHEST FINDINGS  THORACIC INLET/BODY WALL:  No acute abnormality.  MEDIASTINUM:  Normal heart size. No pericardial effusion. Diffuse atherosclerosis, including the coronary arteries.  Right upper paratracheal, left lower paratracheal, subcarinal, and right  hilar lymphadenopathy with central low-density necrotic appearance. The largest node is sub- carinal, 4 cm in diameter. Presumed primary in the right middle lobe measuring 24 mm in diameter. No additional suspicious pulmonary nodules.  LUNG WINDOWS:  Reticulation the lower lungs which is unusual, favoring an interstitial lung disease without honeycombing. Extensive right upper lobe scarring and volume loss with airway obstruction and bronchiectasis. Multiple calcified granulomas.  Saber trachea and  hyperinflation.  OSSEOUS:  No acute fracture.  No suspicious lytic or blastic lesions.  CT ABDOMEN PELVIS FINDINGS  Hepatobiliary: No focal liver abnormality.No evidence of biliary obstruction or stone.  Pancreas: Unremarkable.  Spleen: Unremarkable.  Adrenals/Urinary Tract: Bilateral adrenal nodules, heterogeneous in appearance and measuring up to 25 mm on the right.  No hydronephrosis or stone.  Distended and thick walled bladder, likely chronic outlet obstruction.  Reproductive:Prostatomegaly, projecting into the bladder base.  Stomach/Bowel:  No obstruction. No appendicitis.  Vascular/Lymphatic: No acute vascular abnormality. No definitive malignant adenopathy.  Peritoneal: No ascites or pneumoperitoneum.  Musculoskeletal: No acute abnormalities. Advanced lower lumbar degenerative disc and facet disease.  IMPRESSION: 1. Intrathoracic malignancy, likely bronchogenic carcinoma. There is a right middle lobe pulmonary nodule with right hilar and bilateral mediastinal lymphadenopathy. Bilateral adrenal masses, presumed metastases. 2. Interstitial lung disease. Granulomatous right upper lobe scarring, likely postinfectious.   Electronically Signed   By: Monte Fantasia M.D.   On: 11/07/2014 05:51   Ct Abdomen Pelvis W Contrast  11/07/2014   CLINICAL DATA:  Possible metastatic disease.  Brain masses  EXAM: CT CHEST, ABDOMEN, AND PELVIS WITH CONTRAST  TECHNIQUE: Multidetector CT imaging of the chest, abdomen and pelvis was performed following the standard protocol during bolus administration of intravenous contrast.  CONTRAST:  43m OMNIPAQUE IOHEXOL 300 MG/ML SOLN, 1071mOMNIPAQUE IOHEXOL 300 MG/ML SOLN  COMPARISON:  None.  FINDINGS: CT CHEST FINDINGS  THORACIC INLET/BODY WALL:  No acute abnormality.  MEDIASTINUM:  Normal heart size. No pericardial effusion. Diffuse atherosclerosis, including the coronary arteries.  Right upper paratracheal, left lower paratracheal, subcarinal, and right hilar lymphadenopathy with  central low-density necrotic appearance. The largest node is sub- carinal, 4 cm in diameter. Presumed primary in the right middle lobe measuring 24 mm in diameter. No additional suspicious pulmonary nodules.  LUNG WINDOWS:  Reticulation the lower lungs which is unusual, favoring an interstitial lung disease without honeycombing. Extensive right upper lobe scarring and volume loss with airway obstruction and bronchiectasis. Multiple calcified granulomas.  Saber trachea and hyperinflation.  OSSEOUS:  No acute fracture.  No suspicious lytic or blastic lesions.  CT ABDOMEN PELVIS FINDINGS  Hepatobiliary: No focal liver abnormality.No evidence of biliary obstruction or stone.  Pancreas: Unremarkable.  Spleen: Unremarkable.  Adrenals/Urinary Tract: Bilateral adrenal nodules, heterogeneous in appearance and measuring up to 25 mm on the right.  No hydronephrosis or stone.  Distended and thick walled bladder, likely chronic outlet obstruction.  Reproductive:Prostatomegaly, projecting into the bladder base.  Stomach/Bowel:  No obstruction. No appendicitis.  Vascular/Lymphatic: No acute vascular abnormality. No definitive malignant adenopathy.  Peritoneal: No ascites or pneumoperitoneum.  Musculoskeletal: No acute abnormalities. Advanced lower lumbar degenerative disc and facet disease.  IMPRESSION: 1. Intrathoracic malignancy, likely bronchogenic carcinoma. There is a right middle lobe pulmonary nodule with right hilar and bilateral mediastinal lymphadenopathy. Bilateral adrenal masses, presumed metastases. 2. Interstitial lung disease. Granulomatous right upper lobe scarring, likely postinfectious.   Electronically Signed   By: JoMonte Fantasia.D.   On: 11/07/2014 05:51    Scheduled Meds: . dexamethasone  4  mg Intravenous 6 times per day  . folic acid  1 mg Oral Daily  . insulin aspart  0-9 Units Subcutaneous TID WC  . multivitamin with minerals  1 tablet Oral Daily  . nicotine  21 mg Transdermal Daily  . sodium  chloride  3 mL Intravenous Q12H  . thiamine  100 mg Oral Daily   Or  . thiamine  100 mg Intravenous Daily   Continuous Infusions:      Time spent: 25 minutes    DHUNGEL, [place]  [facility] Pager [phone]. If 7PM-7AM, please contact night-coverage at [email or web address], password [facility] 11/08/2014, 11:49 AM  LOS: 1 day

## 2014-11-08 NOTE — Evaluation (Addendum)
Occupational Therapy Evaluation Patient Details Name: Casey Charles MRN: [id number] DOB: 01-13-1946 Today's Date: 11/08/2014    History of Present Illness 69 year old male with ongoing tobacco use (over 30-pack-year smoking history), ongoing alcohol use 2-4 beers/ day, no prior medical history presented to the ED with sudden onset of left upper extremity weakness and numbness earlier on the day of admission when he woke up to go to the bathroom. This progressed to involve his lower leg and was unable to ambulate. He complained of generalized headaches over the past 1 week worse on the left side, intermittent without any aggravating or relieving factors. Denies any visual symptoms. Reports worsening back pain over the past 1 week as well.   Clinical Impression   Pt admitted with above. Pt reports he was independent with ADLs, PTA. Feel pt will benefit from acute OT to increase independence prior to d/c. Recommending HHOT and 24/7 supervision/assist at home.    Follow Up Recommendations  Home health OT;Supervision/Assistance - 24 hour    Equipment Recommendations  3 in 1 bedside comode (if pt does not have one at home)    Recommendations for Other Services       Precautions / Restrictions Precautions Precautions: Fall Restrictions Weight Bearing Restrictions: No      Mobility Bed Mobility Overal bed mobility: Needs Assistance Bed Mobility: Supine to Sit;Sit to Supine     Supine to sit: Supervision Sit to supine: Min assist      Transfers Overall transfer level: Needs assistance Equipment used: Rolling walker (2 wheeled) Transfers: Sit to/from Stand Sit to Stand: Min assist         General transfer comment: pt stood and was leaning to left the left. Cues and assist for balance.    Balance Overall balance assessment: Needs assistance      Pt with decreased standing balance-leaning to left and assist given for balance-used RW.  Pt with decreased sitting  balance.                                    ADL Overall ADL's : Needs assistance/impaired     Grooming: Wash/dry face;Set up;Sitting;Supervision/safety               Lower Body Dressing: Sit to/from stand;Moderate assistance   Toilet Transfer: Minimal assistance;RW (sit to stand from elevated bed)             General ADL Comments: Pt stood 2x from bed. Pt able to don/doff socks while sitting.      Vision  Pt appeared to have difficulty focusing eyes on therapist.    Perception     Praxis      Pertinent Vitals/Pain Pain Assessment: Faces Faces Pain Scale: Hurts little more Pain Location: left side/LLE Pain Descriptors / Indicators: Guarding/holding Pain Intervention(s): Monitored during session     Hand Dominance     Extremity/Trunk Assessment Upper Extremity Assessment Upper Extremity Assessment: LUE deficits/detail LUE Deficits / Details: weak grip strength LUE Sensation: decreased light touch LUE Coordination: decreased fine motor   Lower Extremity Assessment Lower Extremity Assessment: Defer to PT evaluation       Communication Communication Communication: No difficulties   Cognition Arousal/Alertness: Awake/alert Behavior During Therapy: WFL for tasks assessed/performed Overall Cognitive Status: No family/caregiver present to determine baseline cognitive functioning (slow processing)  General Comments       Exercises       Shoulder Instructions      Home Living Family/patient expects to be discharged to:: Unsure Living Arrangements: Parent;Other relatives (brother; elderly mother) Available Help at Discharge: Family Type of Home: House Home Access: Stairs to enter Technical brewer of Steps: 3 Entrance Stairs-Rails: None Home Layout: One level     Bathroom Shower/Tub: Walk-in shower         Home Equipment: Bedside commode   Additional Comments: unsure of accuracy of home setup  information-per pt report      Prior Functioning/Environment Level of Independence: Independent             OT Diagnosis: Generalized weakness;Acute pain   OT Problem List: Decreased strength;Decreased activity tolerance;Impaired balance (sitting and/or standing);Decreased coordination;Impaired vision/perception;Decreased cognition;Decreased knowledge of use of DME or AE;Decreased knowledge of precautions;Pain   OT Treatment/Interventions: Self-care/ADL training;DME and/or AE instruction;Therapeutic activities;Cognitive remediation/compensation;Visual/perceptual remediation/compensation;Patient/family education;Balance training;Therapeutic exercise;Energy conservation    OT Goals(Current goals can be found in the care plan section) ADL Goals Pt Will Perform Lower Body Bathing: sit to/from stand;with caregiver independent in assisting;with min guard assist Pt Will Perform Lower Body Dressing: with min guard assist;with caregiver independent in assisting;sit to/from stand Pt Will Transfer to Toilet: ambulating;bedside commode;with min assist Pt Will Perform Toileting - Clothing Manipulation and hygiene: with min guard assist;with caregiver independent in assisting;sit to/from stand  OT Frequency: Min 2X/week   Barriers to D/C:            Co-evaluation              End of Session Equipment Utilized During Treatment: Gait belt;Rolling walker  Activity Tolerance: Patient tolerated treatment well Patient left: in bed;with call bell/phone within reach;with bed alarm set   Time: 1315-1331 OT Time Calculation (min): 16 min Charges:  OT General Charges $OT Visit: 1 Procedure OT Evaluation $Initial OT Evaluation Tier I: 1 Procedure G-CodesBenito Mccreedy OTR/L [id number] 11/08/2014, 2:23 PM

## 2014-11-08 NOTE — Evaluation (Signed)
Physical Therapy Evaluation Patient Details Name: Casey Charles MRN: [id number] DOB: 1945-07-13 Today's Date: 11/08/2014   History of Present Illness  69 year old male with ongoing tobacco use (over 30-pack-year smoking history), ongoing alcohol use 2-4 beers/ day, no prior medical history presented to the ED with sudden onset of left upper extremity weakness and numbness earlier on the day of admission when he woke up to go to the bathroom. This progressed to involve his lower leg and was unable to ambulate. He complained of generalized headaches over the past 1 week prior to adm worse on the left side,  back pain  Clinical Impression    Pt admitted with above diagnosis. Pt currently with functional limitations due to the deficits listed below (see PT Problem List). Pt will benefit from skilled PT to increase their independence and safety with mobility to allow discharge to the venue listed below.  Recommend SNF vs HHPT depending on home support and progress; pt would need 24hr assist at this time, will follow   Follow Up Recommendations Home health PT;SNF;Supervision/Assistance - 24 hour (depending on progress)    Equipment Recommendations  Other (comment) (TBA) ?RW/w/c?   Recommendations for Other Services       Precautions / Restrictions Precautions Precautions: Fall Restrictions Weight Bearing Restrictions: No      Mobility  Bed Mobility Overal bed mobility: Needs Assistance Bed Mobility: Supine to Sit;Sit to Supine     Supine to sit: Supervision Sit to supine: Min assist   General bed mobility comments: HOB flat, rails not used; supervision for safety and incr time  Transfers Overall transfer level: Needs assistance Equipment used: Rolling walker (2 wheeled) Transfers: Sit to/from Stand Sit to Stand: Min assist         General transfer comment: pt stood and was leaning to left the left. Cues and assist for balance; pt declined transfer to chair d/t  fatigue  Ambulation/Gait         Gait velocity: 2 small lateral steps to L along EOB with max assist/L knee buckling with WBing and support given from therapist to maintain knee extension      Stairs            Wheelchair Mobility    Modified Rankin (Stroke Patients Only)       Balance Overall balance assessment: Needs assistance Sitting-balance support: Single extremity supported;Bilateral upper extremity supported;Feet supported Sitting balance-Leahy Scale: Poor Sitting balance - Comments: pt requires UE support to maintain balance as well as cues fo midline Postural control: Right lateral lean;Posterior lean   Standing balance-Leahy Scale: Poor Standing balance comment: pt braces LEs on bed in standing and requires  heavy min to mod assist to maintain midline with LOB to right                             Pertinent Vitals/Pain Pain Assessment: No/denies pain Faces Pain Scale: Hurts little more Pain Location: left side/LLE Pain Descriptors / Indicators: Guarding Pain Intervention(s): Monitored during session    Home Living Family/patient expects to be discharged to:: Unsure Living Arrangements: Parent;Other relatives (brother and elderly mother) Available Help at Discharge: Family Type of Home: House Home Access: Stairs to enter Entrance Stairs-Rails: None Entrance Stairs-Number of Steps: 3 Home Layout: One level Home Equipment: Bedside commode Additional Comments: unsure of accuracy of home setup information-per pt report    Prior Function Level of Independence: Independent  Hand Dominance        Extremity/Trunk Assessment   Upper Extremity Assessment: Defer to OT evaluation       LUE Deficits / Details: weak grip strength   Lower Extremity Assessment: LLE deficits/detail RLE Deficits / Details: grossly WFL LLE Deficits / Details: grossly 3/5     Communication   Communication: No difficulties  Cognition  Arousal/Alertness: Awake/alert Behavior During Therapy: WFL for tasks assessed/performed Overall Cognitive Status: No family/caregiver present to determine baseline cognitive functioning Area of Impairment: Problem solving             Problem Solving: Decreased initiation;Slow processing;Difficulty sequencing;Requires verbal cues;Requires tactile cues      General Comments      Exercises        Assessment/Plan    PT Assessment Patient needs continued PT services  PT Diagnosis Difficulty walking;Generalized weakness;Hemiplegia non-dominant side   PT Problem List Decreased strength;Decreased activity tolerance;Decreased coordination;Decreased balance;Decreased mobility  PT Treatment Interventions DME instruction;Gait training;Functional mobility training;Therapeutic activities;Patient/family education;Therapeutic exercise;Balance training   PT Goals (Current goals can be found in the Care Plan section) Acute Rehab PT Goals Patient Stated Goal: to walk PT Goal Formulation: With patient Time For Goal Achievement: 11/15/14 Potential to Achieve Goals: Fair    Frequency Min 3X/week   Barriers to discharge        Co-evaluation               End of Session   Activity Tolerance: Patient tolerated treatment well;Patient limited by fatigue Patient left: in bed;with call bell/phone within reach;with bed alarm set;Other (comment) (bedrails x 4 per pt request)           Time: 9191-6606 PT Time Calculation (min) (ACUTE ONLY): 15 min   Charges:   PT Evaluation $Initial PT Evaluation Tier I: 1 Procedure     PT G CodesKenyon Charles Dec 06, 2014, 2:33 PM

## 2014-11-09 ENCOUNTER — Ambulatory Visit
Admit: 2014-11-09 | Discharge: 2014-11-09 | Disposition: A | Discharge status: Home / Self Care | Payer: Medicare (Managed Care) | Attending: Radiation Oncology | Admitting: Radiation Oncology

## 2014-11-09 ENCOUNTER — Telehealth: Payer: Self-pay | Admitting: *Deleted

## 2014-11-09 DIAGNOSIS — Z510 Encounter for antineoplastic radiation therapy: Secondary | ICD-10-CM | POA: Insufficient documentation

## 2014-11-09 DIAGNOSIS — C801 Malignant (primary) neoplasm, unspecified: Secondary | ICD-10-CM | POA: Insufficient documentation

## 2014-11-09 DIAGNOSIS — R911 Solitary pulmonary nodule: Secondary | ICD-10-CM

## 2014-11-09 DIAGNOSIS — C7931 Secondary malignant neoplasm of brain: Secondary | ICD-10-CM

## 2014-11-09 DIAGNOSIS — Z7189 Other specified counseling: Secondary | ICD-10-CM | POA: Insufficient documentation

## 2014-11-09 DIAGNOSIS — C3411 Malignant neoplasm of upper lobe, right bronchus or lung: Secondary | ICD-10-CM

## 2014-11-09 DIAGNOSIS — M6281 Muscle weakness (generalized): Secondary | ICD-10-CM | POA: Insufficient documentation

## 2014-11-09 DIAGNOSIS — C7800 Secondary malignant neoplasm of unspecified lung: Secondary | ICD-10-CM | POA: Insufficient documentation

## 2014-11-09 DIAGNOSIS — Z515 Encounter for palliative care: Secondary | ICD-10-CM | POA: Insufficient documentation

## 2014-11-09 LAB — GLUCOSE, CAPILLARY
Glucose-Capillary: 143 mg/dL — ABNORMAL HIGH (ref 65–99)
Glucose-Capillary: 132 mg/dL — ABNORMAL HIGH (ref 65–99)
Glucose-Capillary: 111 mg/dL — ABNORMAL HIGH (ref 65–99)
Glucose-Capillary: 107 mg/dL — ABNORMAL HIGH (ref 65–99)

## 2014-11-09 NOTE — Clinical Social Work Note (Signed)
Clinical Social Work Assessment  Patient Details  Name: Casey Charles Risk MRN: [id number] Date of Birth: [birth year]  Date of referral:  11/08/14               Reason for consult:  Facility Placement                Permission sought to share information with:  Family Supports, Customer service manager Permission granted to share information::  Yes, Verbal Permission Granted  Name::     Amado Nash Moore-Mother [phone])  Agency::     Relationship::  Mother  Contact Information:  Amado Nash Moore-Mother [phone])  Housing/Transportation Living arrangements for the past 2 months:  Single Family Home Source of Information:  Patient Patient Interpreter Needed:  None Criminal Activity/Legal Involvement Pertinent to Current Situation/Hospitalization:  No - Comment as needed Significant Relationships:  Parents, Friend Lives with:  Parents Do you feel safe going back to the place where you live?  Yes Need for family participation in patient care:  Yes (Comment)  Care giving concerns:  Patient currently resides with his 68 year old mother who is unable to provide ADL assistance at this time.    Social Worker assessment / plan:  CSW met with patient to discuss discharge plans and recommendation of SNF by PT. Patient reports that he is agreeable with SNF placement and was provided options of SNFs. Patient states that he desires to remain within [facility] if possible. Patient provided permission to contact mother. CSW to complete FL2 and fax out to facilities.   Employment status:  Disabled (Comment on whether or not currently receiving Disability) Insurance information:  Managed Medicare PT Recommendations:  [place] / Referral to community resources:  [place]  Patient/Family's Response to care:  Patient is agreeable with plan for SNF placement.   Patient/Family's Understanding of and Emotional Response to Diagnosis, Current Treatment,  and Prognosis:  Patient verbalizes understanding and recommendation for SNF placement at this time in addition to current diagnosis.   Emotional Assessment Appearance:  Appears stated age Attitude/Demeanor/Rapport:  Other (Patient is calm and cooperative ) Affect (typically observed):  Accepting, Appropriate Orientation:  Oriented to Self, Oriented to Place, Oriented to  Time, Oriented to Situation Alcohol / Substance use:  Not Applicable Psych involvement (Current and /or in the community):  No (Comment)  Discharge Needs  Concerns to be addressed:  Discharge Planning Concerns Readmission within the last 30 days:  No Current discharge risk:  Physical Impairment Barriers to Discharge:  Barriers Resolved   Harriet Masson, LCSW 11/09/2014, 2:41 PM

## 2014-11-09 NOTE — Clinical Social Work Placement (Signed)
   CLINICAL SOCIAL WORK PLACEMENT  NOTE  Date:  11/09/2014  Patient Details  Name: Casey Charles MRN: [id number] Date of Birth: 12/28/1945  Clinical Social Work is seeking post-discharge placement for this patient at the [place] level of care (*CSW will initial, date and re-position this form in  chart as items are completed):  Yes   Patient/family provided with [facility] [facility] [employer] Work Department's list of facilities offering this level of care within the geographic area requested by the patient (or if unable, by the patient's family).  Yes   Patient/family informed of their freedom to choose among providers that offer the needed level of care, that participate in Medicare, Medicaid or managed care program needed by the patient, have an available bed and are willing to accept the patient.  Yes   Patient/family informed of [facility] ownership interest in [facility] and [facility], as well as of the fact that they are under no obligation to receive care at these facilities.  PASRR submitted to EDS on       PASRR number received on       Existing PASRR number confirmed on       FL2 transmitted to all facilities in geographic area requested by pt/family on 11/09/14     FL2 transmitted to all facilities within larger geographic area on       Patient informed that his/her managed care company has contracts with or will negotiate with certain facilities, including the following:            Patient/family informed of bed offers received.  Patient chooses bed at       Physician recommends and patient chooses bed at      Patient to be transferred to   on  .  Patient to be transferred to facility by       Patient family notified on   of transfer.  Name of family member notified:        PHYSICIAN       Additional Comment:    _______________________________________________ Harriet Masson, LCSW 11/09/2014, 3:45 PM

## 2014-11-09 NOTE — Progress Notes (Addendum)
[facility] PROGRESS NOTE  Casey Charles LOV:[id number] DOB: 1945-03-04 DOA: 11/07/2014 PCP: No primary care provider on file. Brief narrative 69 year old male with ongoing tobacco use (over 30-pack-year smoking history), ongoing alcohol use 2-4 beers/ day, no prior medical history presented to the ED with sudden onset of left upper extremity weakness and numbness earlier on the day of admission when he woke up to go to the bathroom. This progressed to involve his lower leg and was unable to ambulate. He complained of generalized headaches over the past 1 week worse on the left side, intermittent without any aggravating or relieving factors. Denies any visual symptoms. Reports worsening back pain over the past 1 week as well. In the ED imaging done showed COPD changes with a right middle lobe pulmonary nodule. Chest CT further confirmed right middle lobe nodule concerning for bronchogenic carcinoma. Head CT was done with numerous hemorrhagic masses consistent with metastatic disease. Patient admitted to hospitalist service. Oncology and pulmonary consulted.   HPI/Subjective: No new complaints. Continues to have weakness of left arm and leg. No cough or shortness of breath.  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 - Plan for brain radiation once biopsy obtained- taper steroids as outpatient -Medical Oncology and radiation oncology following- Patient will need a PET scan as well.  -  Maintain seizure precautions. -PT / OT evaluation-will most likely go to a skilled nursing facility as he does not have adequate support at home (lives with his elderly mother) and he continues to have left-sided weakness-he will likely need to be transported on a daily basis to get radiation to his brain -Avoid aspirin and other blood thinners -Of note, a palliative care consult was requested however, the patient is choosing to be aggressive with treatment at this point- he remains a full code  Alcohol abuse Placed on CIWA-not exhibiting any symptoms of withdrawal - Continue thiamine, folate and multivitamin.  Tobacco abuse Counseled on cessation. Nicotine patch placed.  COPD? -CT imaging reveals interstitial lung disease-no official diagnosis of COPD, not on inhalers at home  Protein calorie malnutrition Nutrition consult  Elevated blood glucose Secondary to Decadron. Monitor with sliding scale coverage.  DVT prophylaxis: SCDs (given multiple hemorrhagic metastasis) Code Status: Full code Family Communication: None at bedside. Disposition Plan: SNF -patient in agreement - may be able to leave after biopsy on Friday  Consultants:  Oncology  Radiation oncology  Pulmonary  Palliative  Procedures:  Head CT  CT chest and abdomen  Antibiotics:  None   Objective: Filed Vitals:   11/09/14 0700  BP: 162/74  Pulse: 76  Temp: 98.2 F (36.8 C)  Resp: 18    Intake/Output Summary (Last 24 hours) at 11/09/14 1246 Last data filed at 11/09/14 0900  Gross per 24 hour  Intake    720 ml  Output    525 ml  Net    195 ml   Filed Weights   11/07/14 0329  Weight: 66.679 kg (147 lb)    Exam:   General: Elderly thin built male in no acute distress  HEENT: No pallor, or  dentition, moist mucosa, no icterus, supple neck  Chest: Clear to auscultation bilaterally, no added sounds  CVS: Normal S1 and S2 no murmurs rub or gallop  GI: Soft, nondistended, nontender,  bowel sounds present  Musculoskeletal: Warm, no edema  CNS: Alert and oriented, mild left facial droop, 4+/5 power in the left upper extremity with some diminished sensation in the hand, 4/5 part in the left leg with diminished sensation, gait not assessed    Data Reviewed: Basic Metabolic Panel:  Recent Labs Lab 11/07/14 0233 11/08/14 0555  NA 134* 134*  K 3.5 4.1  CL 106 100*  CO2 22 23  GLUCOSE 94 151*  BUN 16 15  CREATININE 0.87 0.79  CALCIUM 8.9 10.1   Liver Function Tests: No results for input(s): AST, ALT, ALKPHOS, BILITOT, PROT, ALBUMIN in the last 168 hours. No results for input(s): LIPASE, AMYLASE in the last 168 hours. No results for input(s): AMMONIA in the last 168 hours. CBC:  Recent Labs Lab 11/07/14 0233 11/08/14 0555  WBC 4.4 5.2  NEUTROABS 2.5  --   HGB 13.7 15.7  HCT 40.5 45.7  MCV 92.9 90.3  PLT 184 246   Cardiac Enzymes: No results for input(s): CKTOTAL, CKMB, CKMBINDEX, TROPONINI in the last 168 hours. BNP (last 3 results) No results for input(s): BNP in the last 8760 hours.  ProBNP (last 3 results) No results for input(s): PROBNP in the last 8760 hours.  CBG:  Recent Labs Lab 11/08/14 1424 11/08/14 1710 11/08/14 2133 11/09/14 0754  GLUCAP 168* 119* 143* 143*    No results found for this or any previous visit (from the past 240 hour(s)).   Studies: No results found.  Scheduled Meds: . dexamethasone  4 mg Intravenous 6 times per day  . folic acid  1 mg Oral Daily  . insulin aspart  0-9 Units Subcutaneous TID WC  . multivitamin with minerals  1 tablet Oral Daily  . nicotine  21 mg Transdermal Daily  . sodium chloride  3 mL Intravenous Q12H  . thiamine  100 mg Oral Daily   Or  . thiamine  100 mg Intravenous Daily   Continuous Infusions:      Time spent: 35 minutes    [facility] Pager [phone]. If 7PM-7AM, please contact night-coverage at [email or web address], password [facility] 11/09/2014, 12:46 PM   LOS: 2 days

## 2014-11-09 NOTE — Progress Notes (Signed)
EBUS planned for [facility] on Friday 9/30 at 12pm per Dr. [place] Bells.  PCCM will arrange for pre-procedure needs.     Noe Gens, NP-C [facility] Pgr: [phone] or if no answer [phone] 11/09/2014, 11:01 AM

## 2014-11-09 NOTE — Telephone Encounter (Signed)
Called floor 1519 spoke with patient's RN Alpha Gula?, patient can travel via bed for CT simulation to Brain and chest ,needs to be at Radiation Oncology Dept at 1000am, please medicate for pain if needed, will be with [place] 1 hour, per RN no IVF'S and patient is alert and oriented, thanked Rn and informed Amy RT therapist patient in 1519  8:04 AM

## 2014-11-10 DIAGNOSIS — J449 Chronic obstructive pulmonary disease, unspecified: Secondary | ICD-10-CM

## 2014-11-10 DIAGNOSIS — M6289 Other specified disorders of muscle: Secondary | ICD-10-CM

## 2014-11-10 DIAGNOSIS — J984 Other disorders of lung: Secondary | ICD-10-CM

## 2014-11-10 LAB — GLUCOSE, CAPILLARY
GLUCOSE-CAPILLARY: 111 mg/dL — AB (ref 65–99)
Glucose-Capillary: 128 mg/dL — ABNORMAL HIGH (ref 65–99)
Glucose-Capillary: 125 mg/dL — ABNORMAL HIGH (ref 65–99)
Glucose-Capillary: 132 mg/dL — ABNORMAL HIGH (ref 65–99)

## 2014-11-10 MED ORDER — ENSURE ENLIVE PO LIQD
237.0000 mL | Freq: Two times a day (BID) | ORAL | Status: DC
  Administered 2014-11-12 – 2014-11-14 (×6): 237 mL via ORAL

## 2014-11-10 NOTE — Care Management Important Message (Signed)
Important Message  Patient Details  Name: GODWIN TEDESCO MRN: [id number] Date of Birth: 29-Mar-1945   Medicare Important Message Given:  Yes-second notification given    Shelda Altes 11/10/2014, 3:37 [place] Message  Patient Details  Name: Gabrial Domine Furber MRN: [id number] Date of Birth: 1945-04-29   Medicare Important Message Given:  Yes-second notification given    Shelda Altes 11/10/2014, 3:37 PM

## 2014-11-10 NOTE — Progress Notes (Signed)
Nutrition Follow-up  DOCUMENTATION CODES:   Severe malnutrition in context of chronic illness, Severe malnutrition in context of social or environmental circumstances  INTERVENTION:  - Will order Ensure Enlive BID, each supplement provides 350 kcal and 20 grams of protein - Will order Magic Cup once/day, this supplement provides 290 kcal and 9 grams of protein - Encourage intakes of meals and supplements - RD will continue to monitor for needs  NUTRITION DIAGNOSIS:   Inadequate oral intake related to inability to eat as evidenced by NPO status. -improving with diet advancement and improving appetite   GOAL:   Patient will meet greater than or equal to 90% of their needs -unmet  MONITOR:   Diet advancement, Weight trends, Labs, I & O's, Skin  REASON FOR ASSESSMENT:   Consult Assessment of nutrition requirement/status  ASSESSMENT:   69 yo male, smoker, 1 ppd for 30+ yrs, drinks 1-2 beers per day, with no previous known medical conditions, presented to [facility] ED via EMS for evaluation of sudden onset of LUE weakness and numbness that started around 2 am when he woke up to go to the bathroom. This has progressed to involve LLE and he was eventually unable to ambulate independently. Pt reports noticing generalized headaches over the past week, worse on the left side, intermittent and throbbing, 7/10 in severity when present, occasionally but not consistently radiating to the entire head, no specific alleviating or aggravating factors, no specific associated symptoms such as fevers, chills, visual changes.   9/29  Pt states that he ate 1/2 omelet and drank orange juice for breakfast this AM. Diet was advanced per SLP recommendation on 9/26. Per chart review, pt ate 100% breakfast and 15% lunch yesterday and 100% of both breakfast and dinner 9/27.  Pt states that appetite has been improving and he denies abdominal pain or nausea. He likes ice cream and is willing to try [employer]. Will also  order Ensure Enlive BID to help meet needs. Current needs remain appropriate given metastatic disease with possible lung primary, brain mets.  Variably meeting needs. Medications reviewed. Labs reviewed; CBGs: 107-168 mg/dL, Na: 134 mmol/L, Cl: 100 mmol/L.    Diet Order:  Diet regular Room service appropriate?: Yes with Assist; Fluid consistency:: Thin  Skin:  Reviewed, no issues  Last BM:  9/25  Height:   Ht Readings from Last 1 Encounters:  11/07/14 '6\' 1"'$  (1.854 m)    Weight:   Wt Readings from Last 1 Encounters:  11/07/14 147 lb (66.679 kg)    Ideal Body Weight:  83.64 kg (kg)  BMI:  Body mass index is 19.4 kg/(m^2).  Estimated Nutritional Needs:   Kcal:  2000-2200  Protein:  70-85 grams  Fluid:  2 L/day  EDUCATION NEEDS:   No education needs identified at this time     Jarome Matin, RD, LDN Inpatient Clinical Dietitian Pager # [phone] After hours/weekend pager # [phone]

## 2014-11-10 NOTE — Progress Notes (Signed)
CSW continuing to follow.   CSW followed up with pt at bedside regarding SNF placement.   Pt only has one bed offer for SNF and that bed offer is at [facility]. CSW notified pt and pt is agreeable. Pt provided permission for CSW to contact pt mother.   CSW contacted pt mother via telephone and notified pt mother that only option for SNF is [facility]. Pt mother agreeable to plan.   CSW notified [facility] and facility confirmed that they could accept pt when medically stable for discharge. [facility] stated that facility would initiate insurance authorization process with [employer]. CSW sent [facility] pt radiation treatment schedule as facility plans to arrange transportation for pt to radiation treatments.   CSW to continue to follow to provide support and assist with pt disposition needs.   Alison Murray, MSW, LCSW Clinical Social Work 5E Coverage [phone]

## 2014-11-10 NOTE — Progress Notes (Signed)
[facility] PROGRESS NOTE  Jontavious Commons [place] PPI:[id number] DOB: May 22, 1945 DOA: 11/07/2014 PCP: No primary care provider on file.  HPI:  69 year old male with ongoing tobacco abuse (over 30-pack-year smoking history), alcohol use 2-4 beers/ day, and no prior medical history presented to the ED with sudden onset of left UE numbness and weakness that progressed to LLE later in the day, so severe that patient could no longer ambulate. He complained of generalized headaches over the past 1 week, worse on the left side, intermittent without any aggravating or relieving factors. Denies any visual symptoms. Reports worsening back pain over the past 1 week as well.  In the ED, chest x-ray showed evidence of COPD and a right middle lobe pulmonary nodule. Chest CT confirmed right middle lobe nodule concerning for bronchogenic carcinoma. Head CT with numerous hemorrhagic masses consistent with metastatic disease. Patient admitted to hospitalist service. Oncology, palliative care, and pulmonary were consulted.  Subjective:  Patient still with left sided weakness and "pins and needles", but states his HAs have diminished and he is otherwise comfortable with no complaints. No CP, SOB, abdominal pain, N/V/D.   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 CT chest reveals possible bronchogenic carcinoma with a right middle lobe pulmonary nodule, and brain imaging reveals multiple hemorrhagic brain metastases and significant edema. IV decadron has been initiated. Patient still with left sided weakness and numbness, but HAs have resolved.  - Pulmonary is following and plans to perform endobronchial bx tomorrow.  - Medical oncology and rad oncology are following, plan to taper steroids as outpatient and begin brain radiation once biopsy is obtained. PET scan will be scheduled as outpatient. -  Patient agreeable to SNF upon discharge as he lives with his elderly mother and does not have adequate support at home. PT/OT consulted - Maintain seizure precautions - Avoid ASA and other blood thinners - Palliative care following, patient choosing aggressive treatment at this time and wishes to remain Full Code    Alcohol abuse: Placed on CIWA, no symptoms of withdrawal at this time. Continue thiamine, folate, and multivitamin.   Tobacco abuse: Counseled on cessation. Nicotine patch placed.  COPD?: CT imaging reveals interstitial lung disease, COPD not confirmed at this time. Not on inhalers or home O2. Albuterol nebs as needed if patient becomes SOB. At this time he is breathing comfortably on room air with no wheezing or cough.  Protein calorie malnutrition: Nutrition consulted, recommended oral supplements and diet advancement per SLP recommendation. Continue to encourage good PO intake.   Elevated blood glucose: Secondary to Decadron. CBGs slightly improved. Continue to monitor with sliding scale coverage.  DVT Prophylaxis: SCDs Code Status: Full Family Communication: None at bedside  Disposition Plan: SNF once biopsy has been completed and patient ready for discharge, possibly late tomorrow   Consultants:  Oncology  Radiation Oncology  Pulmonology  Palliative  Procedures:  None, lung biopsy scheduled for tomorrow  Antibiotics:  None   Objective: Filed Vitals:   11/10/14 0459  BP: 146/64  Pulse: 57  Temp: 97.7 F (36.5 C)  Resp: 18    Intake/Output Summary (Last 24 hours) at 11/10/14 1219 Last data filed at 11/10/14 0900  Gross per 24 hour  Intake    360 ml  Output    500 ml  Net   -140 ml   Filed Weights   11/07/14 0329  Weight: 66.679 kg (147 lb)  Exam:   General:  Awake but drowsy, NAD  Neck: Supple, no JVD  Cardiovascular: RRR, no m/r/g  Respiratory: CTAB, no wheezes  Abdomen: Soft, non tender, non distended, +BS  Musculoskeletal: No  LE edema, warm, able to move all 4  Neuro: 4/5 strength in L UE/LE, 5/5 in R UE/LE, alert and oriented, speech clear and appropriate  Psych: Depressed mood, flat affect, speaks slowly  Data Reviewed: Basic Metabolic Panel:  Recent Labs Lab 11/07/14 0233 11/08/14 0555  NA 134* 134*  K 3.5 4.1  CL 106 100*  CO2 22 23  GLUCOSE 94 151*  BUN 16 15  CREATININE 0.87 0.79  CALCIUM 8.9 10.1   Liver Function Tests: No results for input(s): AST, ALT, ALKPHOS, BILITOT, PROT, ALBUMIN in the last 168 hours. No results for input(s): LIPASE, AMYLASE in the last 168 hours. No results for input(s): AMMONIA in the last 168 hours. CBC:  Recent Labs Lab 11/07/14 0233 11/08/14 0555  WBC 4.4 5.2  NEUTROABS 2.5  --   HGB 13.7 15.7  HCT 40.5 45.7  MCV 92.9 90.3  PLT 184 246   Cardiac Enzymes: No results for input(s): CKTOTAL, CKMB, CKMBINDEX, TROPONINI in the last 168 hours. BNP (last 3 results) No results for input(s): BNP in the last 8760 hours.  ProBNP (last 3 results) No results for input(s): PROBNP in the last 8760 hours.  CBG:  Recent Labs Lab 11/09/14 1227 11/09/14 1712 11/09/14 2059 11/10/14 0752 11/10/14 1209  GLUCAP 132* 111* 107* 111* 128*   No results found for this or any previous visit (from the past 240 hour(s)).   Studies: No results found.  Scheduled Meds: . dexamethasone  4 mg Intravenous 6 times per day  . folic acid  1 mg Oral Daily  . insulin aspart  0-9 Units Subcutaneous TID WC  . multivitamin with minerals  1 tablet Oral Daily  . nicotine  21 mg Transdermal Daily  . sodium chloride  3 mL Intravenous Q12H  . thiamine  100 mg Oral Daily   Or  . 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    Metastasis to brain of unknown origin, with vasogenic edema and mass effect    Acute hyponatremia    Accelerated hypertension   Bradycardia   COPD (chronic obstructive pulmonary disease)   Current smoker   Acute left-sided weakness, LUE> LLE   Protein-calorie malnutrition, severe   ETOH abuse   DNR (do not resuscitate) discussion   Palliative care encounter   Left-sided muscle weakness  Time spent: [place], Student-PA  [facility] If 7PM-7AM, please contact night-coverage at [email or web address], password [facility] 11/10/2014, 12:19 PM  LOS: 3 days    Addendum  I personally evaluated patient on 11/10/2014 and agree with the above findings. Mr. Abarca is an unfortunate 69 year old gentleman with a history of tobacco abuse who presented on 11/07/2014 with complaints of left upper extremity weakness and numbness. He was further worked up with a CT scan of brain without contrast that revealed numerous brain MRI June metastasis with associated vasogenic edema and mild mass effect. He was started on IV Decadron. He was further worked up with a CT scan of lungs that revealed findings consistent with bronchogenic carcinoma. Pulmonary critical care medicine was consulted as her plans for him to undergo bronchoscopy on 11/11/2014 to obtain  tissue biopsy. He may undergo brain radiation once biopsy is obtained. Palliative care has also been consulted. On my evaluation today he continues to have 3-5 left upper extremity muscle strength, 4-5 muscle strength to his left lower extremity with 5 out of 5 in strength to right upper and right lower extremities. Was not in acute distress. He is tolerating by mouth intake.

## 2014-11-10 NOTE — Progress Notes (Signed)
   Name: Casey Charles MRN: [id number] DOB: [birth year]    ADMISSION DATE:  11/07/2014 CONSULTATION DATE:  11/07/14  REFERRING MD :  Dr. Doyle Askew   CHIEF COMPLAINT:  Pulmonary nodules, LAN, brain lesions.  Concern for malignancy    BRIEF PATIENT DESCRIPTION: 69 y/o M, smoker, admitted 9/26 with sudden onset LUE weakness/numbness.  Evaluation led to a CT of the chest & head.  Findings concerning for pulmonary nodules with concern for bronchogenic carcinoma and numerous hemorrhagic masses in the brain.  PCCM consulted for possible tissues biopsy of lung lesions.    SIGNIFICANT EVENTS  9/26  Admit with acute onset LUE weakness.  Found to have pulmonary nodules concerning for malignancy & brain lesions  STUDIES:  CT Head 9/26 >> numerous brain masses consistent with hemorrhagic metastases, associated variable vasogenic edema with local mass effect   CT Chest / ABD / Pelvis 9/26 >> intrathoracic malignancy, RML pulmonary nodule with R hilar & bilateral mediastinal LAN, bilateral adrenal masses, presumed metastases, ILD, granulomatous RUL scarring        SUBJECTIVE:  Pt denies SOB, chest pain. Indicates understanding of procedure planned for am.    VITAL SIGNS: Temp:  [97.6 F (36.4 C)-98.2 F (36.8 C)] 97.7 F (36.5 C) (09/29 0459) Pulse Rate:  [55-66] 57 (09/29 0459) Resp:  [18] 18 (09/29 0459) BP: (138-157)/(62-65) 146/64 mmHg (09/29 0459) SpO2:  [100 %] 100 % (09/29 0459)  PHYSICAL EXAMINATION: General:  Thin adult male in NAD Neuro:  Awake, alert, difficult to understand speech but clear, gross motor of LUE, weak grip.  R grip 5/5, L 4/5 HEENT:  MM pink/moist, no jvd Cardiovascular:  s1s2 rrr, no m/r/g Lungs:  resp's even/ non-labored on RA, lungs bilaterally clear  Abdomen:  Flat, soft, bsx4 active  Musculoskeletal:  No acute deformities  Skin:  Warm/dry,no edema, rashes or lesions    Recent Labs Lab 11/07/14 0233 11/08/14 0555  NA 134* 134*  K 3.5 4.1  CL 106 100*    CO2 22 23  BUN 16 15  CREATININE 0.87 0.79  GLUCOSE 94 151*    Recent Labs Lab 11/07/14 0233 11/08/14 0555  HGB 13.7 15.7  HCT 40.5 45.7  WBC 4.4 5.2  PLT 184 246   No results found.  ASSESSMENT / PLAN:  Anterior Segment Right Middle Lobe Nodule - 24 mm Right LAN  Metastatic Brain Lesions / Left Sided Weakness - inability to stand independently   Discussion:  69 y/o M with 30 year hx of smoking admitted with left sided weakness.  Found to have a RML pulmonary nodule, associated LAN and metastatic lesions in brain.     Plan: Bronchoscopy / EBUS  9/30 at [facility] after [facility] Pre-Bronch order set in place Rad ONC following Palliative Care following      Noe Gens, NP-C [facility] Pgr: [phone] or if no answer [phone] 11/10/2014, 2:15 PM

## 2014-11-10 NOTE — Progress Notes (Signed)
Physical Therapy Treatment Patient Details Name: Casey Charles MRN: [id number] DOB: [birth year] Today's Date: 11/10/2014    History of Present Illness 69 year old male with ongoing tobacco use (over 30-pack-year smoking history), ongoing alcohol use 2-4 beers/ day, no prior medical history presented to the ED with sudden onset of left upper extremity weakness and numbness earlier on the day of admission when he woke up to go to the bathroom. This progressed to involve his lower leg and was unable to ambulate. He complained of generalized headaches over the past 1 week prior to adm worse on the left side,  back pain    PT Comments    Pt will need SNF/ 24 hr care; requiring more assist today than previous visit  Follow Up Recommendations  Supervision/Assistance - 24 hour;SNF     Equipment Recommendations  None recommended by PT    Recommendations for Other Services       Precautions / Restrictions Precautions Precautions: Fall Restrictions Weight Bearing Restrictions: No    Mobility  Bed Mobility Overal bed mobility: Needs Assistance Bed Mobility: Supine to Sit;Sit to Supine     Supine to sit: Min guard     General bed mobility comments: incr time and cues for safe technique  Transfers Overall transfer level: Needs assistance Equipment used: Rolling walker (2 wheeled) Transfers: Sit to/from Stand Sit to Stand: Min assist;+2 physical assistance         General transfer comment: assist for transition, and balance once in standing, LOB to Left  Ambulation/Gait Ambulation/Gait assistance: +2 physical assistance;Mod assist;Max assist Ambulation Distance (Feet): 20 Feet Assistive device: Rolling walker (2 wheeled);2 person hand held assist Gait Pattern/deviations: Ataxic;Narrow base of support;Wide base of support;Staggering left     General Gait Details: amb 59' with RW,12' without; pt able to grip walker with L hand but difficulty wt shifting, BOS and maneuvering;  LOB to L, pt with severe ataxia, poor proprioception LLE/difficulty following  multimodal commands to correct LLE placement, wt shift; +2 throughout to prevent falls; 3rd person to bring chair to pt as he became completely unable to follow commands, although he remained alert/awake; BP 183/71, HR 56, sats 100%; RN notified   Stairs            Wheelchair Mobility    Modified Rankin (Stroke Patients Only)       Balance Overall balance assessment: Needs assistance Sitting-balance support: Bilateral upper extremity supported;Feet supported Sitting balance-Leahy Scale: Poor Sitting balance - Comments: pt requires UE support to maintain balance as well as cues for midline Postural control: Right lateral lean Standing balance support: Bilateral upper extremity supported;During functional activity Standing balance-Leahy Scale: Zero Standing balance comment: pt requires +2 assist to maintain dynamic balance                    Cognition Arousal/Alertness: Awake/alert Behavior During Therapy: Flat affect;Impulsive Overall Cognitive Status: No family/caregiver present to determine baseline cognitive functioning Area of Impairment: Problem solving             Problem Solving: Decreased initiation;Slow processing;Difficulty sequencing;Requires verbal cues;Requires tactile cues General Comments: pt with delayed response time    Exercises      General Comments        Pertinent Vitals/Pain Pain Assessment: No/denies pain    Home Living                      Prior Function  PT Goals (current goals can now be found in the care plan section) Acute Rehab PT Goals Patient Stated Goal: to walk PT Goal Formulation: With patient Time For Goal Achievement: 11/15/14 Potential to Achieve Goals: Fair Progress towards PT goals: Not progressing toward goals - comment (see note)    Frequency  Min 3X/week    PT Plan Current plan remains appropriate     Co-evaluation             End of Session Equipment Utilized During Treatment: Gait belt Activity Tolerance: Other (comment) (cognition--?) Patient left: in chair;with call bell/phone within reach;with chair alarm set     Time: 7680-8811 PT Time Calculation (min) (ACUTE ONLY): 25 min  Charges:  $Gait Training: 8-22 mins $Therapeutic Activity: 8-22 mins                    G Codes:      WILLIAMS,TARA 12/10/14, 3:57 PM

## 2014-11-11 ENCOUNTER — Inpatient Hospital Stay (HOSPITAL_COMMUNITY): Payer: Medicare (Managed Care) | Admitting: Anesthesiology

## 2014-11-11 ENCOUNTER — Inpatient Hospital Stay (HOSPITAL_COMMUNITY): Payer: Medicare (Managed Care)

## 2014-11-11 ENCOUNTER — Encounter (HOSPITAL_COMMUNITY)
Admission: EM | Disposition: A | Discharge status: Skilled Nursing Facility | Payer: Self-pay | Source: Home / Self Care | Attending: Internal Medicine

## 2014-11-11 ENCOUNTER — Encounter (HOSPITAL_COMMUNITY): Payer: Self-pay | Admitting: *Deleted

## 2014-11-11 DIAGNOSIS — R599 Enlarged lymph nodes, unspecified: Secondary | ICD-10-CM

## 2014-11-11 DIAGNOSIS — R590 Localized enlarged lymph nodes: Secondary | ICD-10-CM | POA: Insufficient documentation

## 2014-11-11 DIAGNOSIS — Z510 Encounter for antineoplastic radiation therapy: Secondary | ICD-10-CM | POA: Diagnosis not present

## 2014-11-11 DIAGNOSIS — C3411 Malignant neoplasm of upper lobe, right bronchus or lung: Secondary | ICD-10-CM

## 2014-11-11 DIAGNOSIS — C3491 Malignant neoplasm of unspecified part of right bronchus or lung: Secondary | ICD-10-CM | POA: Insufficient documentation

## 2014-11-11 HISTORY — PX: ENDOBRONCHIAL ULTRASOUND: SHX5096

## 2014-11-11 LAB — GLUCOSE, CAPILLARY
Glucose-Capillary: 129 mg/dL — ABNORMAL HIGH (ref 65–99)
Glucose-Capillary: 132 mg/dL — ABNORMAL HIGH (ref 65–99)
Glucose-Capillary: 120 mg/dL — ABNORMAL HIGH (ref 65–99)

## 2014-11-11 SURGERY — ENDOBRONCHIAL ULTRASOUND (EBUS)
Anesthesia: General | Laterality: Bilateral

## 2014-11-11 MED ORDER — REMIFENTANIL HCL 1 MG IV SOLR
INTRAVENOUS | Status: AC
  Filled 2014-11-11: qty 1000

## 2014-11-11 MED ORDER — AMLODIPINE BESYLATE 10 MG PO TABS
10.0000 mg | ORAL_TABLET | Freq: Every day | ORAL | Status: DC
  Administered 2014-11-11 – 2014-11-14 (×4): 10 mg via ORAL
  Filled 2014-11-11 (×4): qty 1

## 2014-11-11 MED ORDER — SODIUM CHLORIDE 0.9 % IJ SOLN
INTRAMUSCULAR | Status: AC
  Filled 2014-11-11: qty 20

## 2014-11-11 MED ORDER — GLYCOPYRROLATE 0.2 MG/ML IJ SOLN
INTRAMUSCULAR | Status: DC | PRN
  Administered 2014-11-11: 0.4 mg via INTRAVENOUS

## 2014-11-11 MED ORDER — BUTAMBEN-TETRACAINE-BENZOCAINE 2-2-14 % EX AERO: 1.0000 | INHALATION_SPRAY | Freq: Once | CUTANEOUS | Status: DC

## 2014-11-11 MED ORDER — FENTANYL CITRATE (PF) 100 MCG/2ML IJ SOLN
INTRAMUSCULAR | Status: DC | PRN
  Administered 2014-11-11: 25 ug via INTRAVENOUS

## 2014-11-11 MED ORDER — PROPOFOL 10 MG/ML IV BOLUS
INTRAVENOUS | Status: AC
  Filled 2014-11-11: qty 20

## 2014-11-11 MED ORDER — REMIFENTANIL HCL 1 MG IV SOLR
INTRAVENOUS | Status: DC | PRN
  Administered 2014-11-11: .5 ug/kg/min via INTRAVENOUS
  Administered 2014-11-11: .25 ug/kg/min via INTRAVENOUS

## 2014-11-11 MED ORDER — MIDAZOLAM HCL 5 MG/5ML IJ SOLN
INTRAMUSCULAR | Status: DC | PRN
  Administered 2014-11-11: 2 mg via INTRAVENOUS

## 2014-11-11 MED ORDER — MIDAZOLAM HCL 2 MG/2ML IJ SOLN
INTRAMUSCULAR | Status: AC
  Filled 2014-11-11: qty 4

## 2014-11-11 MED ORDER — ONDANSETRON HCL 4 MG/2ML IJ SOLN
INTRAMUSCULAR | Status: AC
  Filled 2014-11-11: qty 2

## 2014-11-11 MED ORDER — FENTANYL CITRATE (PF) 100 MCG/2ML IJ SOLN
INTRAMUSCULAR | Status: AC
  Filled 2014-11-11: qty 4

## 2014-11-11 MED ORDER — NEOSTIGMINE METHYLSULFATE 10 MG/10ML IV SOLN
INTRAVENOUS | Status: DC | PRN
  Administered 2014-11-11: 5 mg via INTRAVENOUS

## 2014-11-11 MED ORDER — LACTATED RINGERS IV SOLN
INTRAVENOUS | Status: DC
  Administered 2014-11-11: 14:00:00 via INTRAVENOUS
  Administered 2014-11-11: 1000 mL via INTRAVENOUS

## 2014-11-11 MED ORDER — LABETALOL HCL 5 MG/ML IV SOLN
10.0000 mg | INTRAVENOUS | Status: DC | PRN
  Filled 2014-11-11: qty 4

## 2014-11-11 MED ORDER — SUCCINYLCHOLINE CHLORIDE 20 MG/ML IJ SOLN
INTRAMUSCULAR | Status: DC | PRN
  Administered 2014-11-11: 100 mg via INTRAVENOUS

## 2014-11-11 MED ORDER — LIDOCAINE HCL 2 % EX GEL
1.0000 "application " | Freq: Once | CUTANEOUS | Status: DC
  Filled 2014-11-11: qty 5

## 2014-11-11 MED ORDER — PHENYLEPHRINE HCL 0.25 % NA SOLN: 1.0000 | Freq: Four times a day (QID) | NASAL | Status: DC | PRN

## 2014-11-11 MED ORDER — PROPOFOL 10 MG/ML IV BOLUS
INTRAVENOUS | Status: DC | PRN
  Administered 2014-11-11: 100 mg via INTRAVENOUS

## 2014-11-11 MED ORDER — METOPROLOL TARTRATE 1 MG/ML IV SOLN
INTRAVENOUS | Status: DC | PRN
  Administered 2014-11-11: 2 mg via INTRAVENOUS

## 2014-11-11 MED ORDER — ROCURONIUM BROMIDE 100 MG/10ML IV SOLN
INTRAVENOUS | Status: DC | PRN
  Administered 2014-11-11: 10 mg via INTRAVENOUS
  Administered 2014-11-11: 20 mg via INTRAVENOUS

## 2014-11-11 NOTE — H&P (Signed)
[facility] P[facility]M  HPI: Casey Charles was admitted with weakness, numbness, found to have a lung mass, subcarinal mass and brain mass Family desires biopsy to know prognosis.  He is feeling well today, ready to proceed with an EBUS, understands the procedure  Past Medical History  Diagnosis Date  . Chronic back pain      History reviewed. No pertinent family history.   Social History   Social History  . Marital Status: Single    Spouse Name: N/A  . Number of Children: N/A  . Years of Education: N/A   Occupational History  . Not on file.   Social History Main Topics  . Smoking status: Current Every Day Smoker -- 1.00 packs/day    Types: Cigarettes  . Smokeless tobacco: Not on file  . Alcohol Use: Yes     Comment: daily  . Drug Use: No  . Sexual Activity: Yes   Other Topics Concern  . Not on file   Social History Narrative     No Known Allergies   '@encmedstart'$ @   Filed Vitals:   11/10/14 2048 11/11/14 0516 11/11/14 0833 11/11/14 1135  BP: 159/88 166/65 159/64 179/68  Pulse: 58 60 69   Temp: 97.7 F (36.5 C) 97.5 F (36.4 C) 97.6 F (36.4 C) 97.9 F (36.6 C)  TempSrc: Oral Oral Oral Oral  Resp: '18 16 18 16  '$ Height:      Weight:      SpO2: 100% 100% 99% 100%    Gen: chronically ill appearing HENT: OP clear, TM's clear, neck supple PULM: CTA B, normal percussion CV: RRR, no mgr, trace edema GI: BS+, soft, nontender Derm: no cyanosis or rash Psyche: normal mood and affect  BMET    Component Value Date/Time   NA 134* 11/08/2014 0555   K 4.1 11/08/2014 0555   CL 100* 11/08/2014 0555   CO2 23 11/08/2014 0555   GLUCOSE 151* 11/08/2014 0555   BUN 15 11/08/2014 0555   CREATININE 0.79 11/08/2014 0555   CALCIUM 10.1 11/08/2014 0555   GFRNONAA >60 11/08/2014 0555   GFRAA >60 11/08/2014 0555     CBC    Component Value Date/Time   WBC 5.2 11/08/2014 0555   RBC 5.06 11/08/2014 0555   HGB 15.7 11/08/2014 0555   HCT 45.7 11/08/2014 0555   PLT 246 11/08/2014  0555   MCV 90.3 11/08/2014 0555   MCH 31.0 11/08/2014 0555   MCHC 34.4 11/08/2014 0555   RDW 12.6 11/08/2014 0555   LYMPHSABS 1.3 11/07/2014 0233   MONOABS 0.5 11/07/2014 0233   EOSABS 0.1 11/07/2014 0233   BASOSABS 0.0 11/07/2014 0233    Impression/Plan: New brain mass, subcarinal mass, plan for bronchoscopy with EBUS today for needle aspiration of subcarinal mass  Roselie Awkward, MD [place] [facility] Pager: [phone] Cell: [phone] After 3pm or if no response, call [phone]

## 2014-11-11 NOTE — Op Note (Signed)
Video Bronchoscopy with Endobronchial Ultrasound Procedure Note  Date of Operation: 11/11/2014  Pre-op Diagnosis: 11/11/2014  Post-op Diagnosis: Non-small cell lung cancer  Surgeon: Roselie Awkward  Assistants: Dillard Cannon  Anesthesia: General endotracheal anesthesia  Operation: Flexible video fiberoptic bronchoscopy with endobronchial ultrasound and biopsies.  Estimated Blood Loss: Minimal  Complications: None immediate  Indications and History: Casey Charles is a 69 y.o. male with a new focal weakness, brain mass and lung mass.  The risks, benefits, complications, treatment options and expected outcomes were discussed with the patient.  The possibilities of pneumothorax, pneumonia, reaction to medication, pulmonary aspiration, perforation of a viscus, bleeding, failure to diagnose a condition and creating a complication requiring transfusion or operation were discussed with the patient who freely signed the consent.    Description of Procedure: The patient was examined in the preoperative area and history and data from the preprocedure consultation were reviewed. It was deemed appropriate to proceed.  The patient was taken to the [employer], identified as [facility] and the procedure verified as Flexible Video Fiberoptic Bronchoscopy.  A Time Out was held and the above information confirmed. After being taken to the operating room general anesthesia was initiated and the patient  was orally intubated. The video fiberoptic bronchoscope was introduced via the endotracheal tube and a general inspection was performed which showed mild narrowing in the right middle lobe.  There was no clear endobronchial lesion noted. The standard scope was then withdrawn and the endobronchial ultrasound was used to identify and characterize the peritracheal, hilar and bronchial lymph nodes. Inspection showed a large subcarinal mass which appeared necrotic. Using real-time ultrasound guidance  Wang needle biopsies were take from Station 7 nodes and were sent for cytology. The patient tolerated the procedure well without apparent complications. There was no significant blood loss. The bronchoscope was withdrawn. Anesthesia was reversed and the patient was taken to the PACU for recovery.   Samples: 1. Wang needle biopsies from 7 node  Plans:  The patient will be discharged from the PACU to home when recovered from anesthesia. We will review the cytology, pathology and microbiology results with the patient when they become available. Outpatient followup will be with Oncology.    Roselie Awkward, MD [place] [facility] Pager: [phone] Cell: [phone] After 3pm or if no response, call [phone] 11/11/2014  '@today'$ @

## 2014-11-11 NOTE — Anesthesia Procedure Notes (Addendum)
Procedure Name: Intubation Date/Time: 11/11/2014 12:52 PM Performed by: Deliah Boston Pre-anesthesia Checklist: Patient identified, Emergency Drugs available, Suction available and Patient being monitored Patient Re-evaluated:Patient Re-evaluated prior to inductionOxygen Delivery Method: Circle System Utilized Preoxygenation: Pre-oxygenation with 100% oxygen Intubation Type: IV induction Ventilation: Mask ventilation without difficulty Laryngoscope Size: Mac and 4 Grade View: Grade I Tube type: Oral Tube size: 8.5 mm Number of attempts: 1 Airway Equipment and Method: Stylet and Oral airway Placement Confirmation: ETT inserted through vocal cords under direct vision,  positive ETCO2 and breath sounds checked- equal and bilateral Secured at: 23 cm Tube secured with: Tape Dental Injury: Teeth and Oropharynx as per pre-operative assessment  Comments: ETT cut at 28 Per Dr. Anastasia Pall resuest

## 2014-11-11 NOTE — Anesthesia Preprocedure Evaluation (Addendum)
Anesthesia Evaluation  Patient identified by MRN, date of birth, ID band Patient awake    Reviewed: Allergy & Precautions, NPO status , Patient's Chart, lab work & pertinent test results  Airway Mallampati: II  TM Distance: >3 FB Neck ROM: Full    Dental   Pulmonary COPD, Current Smoker,    breath sounds clear to auscultation       Cardiovascular hypertension,  Rhythm:Regular Rate:Normal     Neuro/Psych    GI/Hepatic   Endo/Other    Renal/GU      Musculoskeletal   Abdominal   Peds  Hematology   Anesthesia Other Findings   Reproductive/Obstetrics                           Anesthesia Physical Anesthesia Plan  ASA: IV  Anesthesia Plan: General   Post-op Pain Management:    Induction: Intravenous, Rapid sequence and Cricoid pressure planned  Airway Management Planned: Oral ETT  Additional Equipment:   Intra-op Plan:   Post-operative Plan: Possible Post-op intubation/ventilation  Informed Consent: I have reviewed the patients History and Physical, chart, labs and discussed the procedure including the risks, benefits and alternatives for the proposed anesthesia with the patient or authorized representative who has indicated his/her understanding and acceptance.   Dental advisory given  Plan Discussed with: CRNA and Anesthesiologist  Anesthesia Plan Comments:       Anesthesia Quick Evaluation

## 2014-11-11 NOTE — Progress Notes (Signed)
OT Cancellation Note  Patient Details Name: Casey Charles MRN: [id number] DOB: Jul 29, 1945   Cancelled Treatment:    Reason Eval/Treat Not Completed: Other (comment) Pt for bronchoscopy very soon this am per nursing tech. Will check back later time.  Jules Schick  [phone] 11/11/2014, 10:55 AM

## 2014-11-11 NOTE — Anesthesia Postprocedure Evaluation (Signed)
  Anesthesia Post-op Note  Patient: Casey Charles  Procedure(s) Performed: Procedure(s): ENDOBRONCHIAL ULTRASOUND (Bilateral)  Patient Location: PACU  Anesthesia Type:General  Level of Consciousness: awake  Airway and Oxygen Therapy: Patient Spontanous Breathing  Post-op Pain: mild  Post-op Assessment: Post-op Vital signs reviewed   LLE Sensation: Numbness, Tingling   RLE Sensation: No numbness, No tingling L Sensory Level: S1-Sole of foot, small toes R Sensory Level: S1-Sole of foot, small toes  Post-op Vital Signs: Reviewed  Last Vitals:  Filed Vitals:   11/11/14 1445  BP:   Pulse: 72  Temp:   Resp: 17    Complications: No apparent anesthesia complications

## 2014-11-11 NOTE — Progress Notes (Signed)
[facility] PROGRESS NOTE  Suheyb Raucci Swander ZTI:[id number] DOB: [birth year] DOA: 11/07/2014 PCP: No primary care provider on file.  HPI:  69 year old male with ongoing tobacco abuse (over 30-pack-year smoking history), alcohol use 2-4 beers/ day, and no prior medical history presented to the ED with sudden onset of left UE numbness and weakness that progressed to LLE later in the day, so severe that patient could no longer ambulate. He complained of generalized headaches over the past 1 week, worse on the left side, intermittent without any aggravating or relieving factors. Denies any visual symptoms. Reports worsening back pain over the past 1 week as well.  In the ED, chest x-ray showed evidence of COPD and a right middle lobe pulmonary nodule. Chest CT confirmed right middle lobe nodule concerning for bronchogenic carcinoma. Head CT with numerous hemorrhagic masses consistent with metastatic disease. Patient admitted to hospitalist service. Oncology, palliative care, and pulmonary were consulted.  Subjective:  Patient reports vast improvement in numbness and weakness of L UE/LE today. No complaints at this time, including CP, SOB, abd pain, N/V/D.   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 CT chest reveals possible bronchogenic carcinoma with a right middle lobe pulmonary nodule, and brain imaging reveals multiple hemorrhagic brain metastases and significant edema. IV decadron has been initiated, and patient now with minimal left sided weakness and numbness, and HAs have resolved. Continue pain control and supportive measures.   - Pulmonary is following and plans to perform endobronchial bx this afternoon. Will await their assessment before discharging.   - Medical oncology and rad oncology are following, plan to taper steroids as outpatient and begin brain radiation  once biopsy is obtained. PET scan will be scheduled as outpatient. - Patient agreeable to SNF upon discharge as he lives with his elderly mother and does not have adequate support at home. PT/OT consulted - Maintain seizure precautions - Avoid ASA and other blood thinners - Palliative care following, patient choosing aggressive treatment at this time and wishes to remain Full Code   Alcohol abuse: Placed on CIWA, no symptoms of withdrawal at this time. Continue thiamine, folate, and multivitamin.   Tobacco abuse: Counseled on cessation. Nicotine patch placed.  COPD?: CT imaging reveals interstitial lung disease, COPD not confirmed at this time. Not on inhalers or home O2. Albuterol nebs as needed if patient becomes SOB. At this time he is breathing comfortably on room air with no wheezing or cough.  Protein calorie malnutrition: Nutrition consulted, recommended oral supplements and diet advancement per SLP recommendation. Continue to encourage good PO intake.   Elevated blood glucose: Secondary to Decadron. CBGs slightly improved. Continue to monitor with sliding scale coverage.  DVT Prophylaxis: SCDs Code Status: Full code Family Communication: None at bedside  Disposition Plan: SNF once pulmonology clears him, likely later today after endobronchial biopsy  Consultants:  Oncology  Radiation Oncology  Pulmonology  Palliative  Procedures:  Endobronchial biopsy scheduled for today  Antibiotics:  None  Objective: Filed Vitals:   11/11/14 1135  BP: 179/68  Pulse:   Temp: 97.9 F (36.6 C)  Resp: 16    Intake/Output Summary (Last 24 hours) at 11/11/14 1136 Last data filed at 11/11/14 0900  Gross per 24 hour  Intake    120 ml  Output    400 ml  Net   -280 ml   Filed Weights   11/07/14 0329  Weight:  66.679 kg (147 lb)   Exam:   General:  Awake and alert, resting comfortably, NAD  Neck: Supple, no JVD  Cardiovascular: RRR, no m/r/g  Respiratory: CTAB,  normal respiratory effort  Abdomen: Soft, non tender, non distended, +BS  Musculoskeletal: No LE edema, warm, able to move all 4  Neuro: 5/5 strength in all 4, alert and oriented, speech clear and appropriate  Psych: Depressed mood, flat affect   Data Reviewed: Basic Metabolic Panel:  Recent Labs Lab 11/07/14 0233 11/08/14 0555  NA 134* 134*  K 3.5 4.1  CL 106 100*  CO2 22 23  GLUCOSE 94 151*  BUN 16 15  CREATININE 0.87 0.79  CALCIUM 8.9 10.1   Liver Function Tests: No results for input(s): AST, ALT, ALKPHOS, BILITOT, PROT, ALBUMIN in the last 168 hours. No results for input(s): LIPASE, AMYLASE in the last 168 hours. No results for input(s): AMMONIA in the last 168 hours. CBC:  Recent Labs Lab 11/07/14 0233 11/08/14 0555  WBC 4.4 5.2  NEUTROABS 2.5  --   HGB 13.7 15.7  HCT 40.5 45.7  MCV 92.9 90.3  PLT 184 246   Cardiac Enzymes: No results for input(s): CKTOTAL, CKMB, CKMBINDEX, TROPONINI in the last 168 hours. BNP (last 3 results) No results for input(s): BNP in the last 8760 hours.  ProBNP (last 3 results) No results for input(s): PROBNP in the last 8760 hours.  CBG:  Recent Labs Lab 11/10/14 0752 11/10/14 1209 11/10/14 1731 11/10/14 2047 11/11/14 0737  GLUCAP 111* 128* 125* 132* 129*   No results found for this or any previous visit (from the past 240 hour(s)).   Studies: No results found.  Scheduled Meds: . dexamethasone  4 mg Intravenous 6 times per day  . feeding supplement (ENSURE ENLIVE)  237 mL Oral BID BM  . folic acid  1 mg Oral Daily  . insulin aspart  0-9 Units Subcutaneous TID WC  . multivitamin with minerals  1 tablet Oral Daily  . nicotine  21 mg Transdermal Daily  . sodium chloride  3 mL Intravenous Q12H  . thiamine  100 mg Oral Daily   Or  . 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, with vasogenic edema and mass effect    Acute hyponatremia   Accelerated hypertension   Bradycardia   COPD (chronic obstructive pulmonary disease)   Current smoker   Acute left-sided weakness, LUE> LLE   Protein-calorie malnutrition, severe   ETOH abuse   DNR (do not resuscitate) discussion   Palliative care encounter   Left-sided muscle weakness  Time spent: [place], Student-PA  [facility] If 7PM-7AM, please contact night-coverage at [email or web address], password [facility] 11/11/2014, 11:36 AM  LOS: 4 days    Addendum  I personally evaluated patient on 11/11/2014 and agree with above findings. Mr Lie is a pleasant 69 year old gentleman with history of tobacco abuse admitted on 11/07/2014 with complaints of left upper extremity weakness and numbness. Unfortunately CT scan of brain revealed numerous metastasis that was associate with vasovagal edema and mild mass effect. He was started on IV Decadron. Further workup with a CT scan of lungs revealed findings consistent with carcinoma. Pulmonary critical care medicine was consulted as he underwent bronchoscopy on 11/11/2014. I discussed case with Dr. 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 Patient deconditioned having increase in healthcare needs. Family members unable to care for him at home for which social work was consulted for skilled nursing facility placement for acute rehabilitation. With his frail condition will monitor patient over the next 24 hours to ensure stability post procedure. Anticipate weekend discharge to skilled nursing facility for acute rehabilitation. He will require hospital follow-up with medical oncology in the outpatient setting to go over biopsy results and discuss treatment options.

## 2014-11-11 NOTE — Transfer of Care (Signed)
Immediate Anesthesia Transfer of Care Note  Patient: Casey Charles  Procedure(s) Performed: Procedure(s): ENDOBRONCHIAL ULTRASOUND (Bilateral)  Patient Location: PACU  Anesthesia Type:General  Level of Consciousness: Patient easily awoke, cooperative, following commands, responds to stimulation.   Airway & Oxygen Therapy: Patient spontaneously breathing, ventilating well, oxygen via simple oxygen mask.  Post-op Assessment: Report given to PACU RN, vital signs reviewed and stable, moving all extremities.   Post vital signs: Reviewed and stable.  Complications: No apparent anesthesia complications

## 2014-11-11 NOTE — Progress Notes (Signed)
eLink Physician-Brief Progress Note Patient Name: Casey Charles DOB: 1945-11-03 MRN: [id number]   Date of Service  11/11/2014  HPI/Events of Note  Post procedure doing well  eICU Interventions  deit restrt     Intervention Category Klich Interventions: Routine modifications to care plan (e.g. PRN medications for pain, fever)  Raylene Miyamoto. 11/11/2014, 5:16 PM

## 2014-11-11 NOTE — Progress Notes (Signed)
Dr. Oletta Lamas by to see Pt. Pt is now resting comfortably and states his back is feeling better. Pt VSS stable and SBP 160-170. No new orders

## 2014-11-11 NOTE — Progress Notes (Signed)
[facility]  Intra-operative preliminary diagnosis of non-small cell cancer.  Will notify primary service and family if they are available.  Roselie Awkward, MD [facility] Pager: [phone] Cell: [phone] After 3pm or if no response, call [phone]

## 2014-11-12 DIAGNOSIS — R339 Retention of urine, unspecified: Secondary | ICD-10-CM

## 2014-11-12 LAB — CBC
HCT: 46.6 % (ref 39.0–52.0)
Hemoglobin: 16.1 g/dL (ref 13.0–17.0)
MCH: 31.3 pg (ref 26.0–34.0)
MCHC: 34.5 g/dL (ref 30.0–36.0)
MCV: 90.7 fL (ref 78.0–100.0)
PLATELETS: 199 10*3/uL (ref 150–400)
RBC: 5.14 MIL/uL (ref 4.22–5.81)
RDW: 12.4 % (ref 11.5–15.5)
WBC: 5.5 10*3/uL (ref 4.0–10.5)

## 2014-11-12 LAB — URINALYSIS, ROUTINE W REFLEX MICROSCOPIC
BILIRUBIN URINE: NEGATIVE
Glucose, UA: NEGATIVE mg/dL
Hgb urine dipstick: NEGATIVE
KETONES UR: NEGATIVE mg/dL
Leukocytes, UA: NEGATIVE
NITRITE: NEGATIVE
PH: 6 (ref 5.0–8.0)
PROTEIN: NEGATIVE mg/dL
Specific Gravity, Urine: 1.021 (ref 1.005–1.030)
UROBILINOGEN UA: 1 mg/dL (ref 0.0–1.0)

## 2014-11-12 LAB — BASIC METABOLIC PANEL
Anion gap: 11 (ref 5–15)
BUN: 23 mg/dL — AB (ref 6–20)
CALCIUM: 9.3 mg/dL (ref 8.9–10.3)
CHLORIDE: 101 mmol/L (ref 101–111)
CO2: 23 mmol/L (ref 22–32)
CREATININE: 0.79 mg/dL (ref 0.61–1.24)
Glucose, Bld: 133 mg/dL — ABNORMAL HIGH (ref 65–99)
Potassium: 4.2 mmol/L (ref 3.5–5.1)
SODIUM: 135 mmol/L (ref 135–145)

## 2014-11-12 LAB — GLUCOSE, CAPILLARY
GLUCOSE-CAPILLARY: 112 mg/dL — AB (ref 65–99)
Glucose-Capillary: 133 mg/dL — ABNORMAL HIGH (ref 65–99)
Glucose-Capillary: 171 mg/dL — ABNORMAL HIGH (ref 65–99)

## 2014-11-12 MED ORDER — ISOSORBIDE MONONITRATE 15 MG HALF TABLET
15.0000 mg | ORAL_TABLET | Freq: Every day | ORAL | Status: DC
  Administered 2014-11-12 – 2014-11-14 (×3): 15 mg via ORAL
  Filled 2014-11-12 (×3): qty 1

## 2014-11-12 MED ORDER — PHENAZOPYRIDINE HCL 100 MG PO TABS
100.0000 mg | ORAL_TABLET | Freq: Once | ORAL | Status: AC
  Administered 2014-11-12: 100 mg via ORAL
  Filled 2014-11-12: qty 1

## 2014-11-12 NOTE — Progress Notes (Signed)
This shift pt c/o increasing pain with urination, frequency and dribbling. Loud moaning coming from pt each time he uses urinal. Attending T. Rogue Bussing notified. New orders given for pyridium. Will continue to monitor pt and intervene appropriately.

## 2014-11-12 NOTE — Progress Notes (Signed)
[facility] PROGRESS NOTE  Derry Kassel Shira QIO:[id number] DOB: 01-22-1946 DOA: 11/07/2014 PCP: No primary care provider on file.  HPI:  69 year old male with ongoing tobacco abuse (over 30-pack-year smoking history), alcohol use 2-4 beers/ day, and no prior medical history presented to the ED with sudden onset of left UE numbness and weakness that progressed to LLE later in the day, so severe that patient could no longer ambulate. He complained of generalized headaches over the past 1 week, worse on the left side, intermittent without any aggravating or relieving factors. Denies any visual symptoms. Reports worsening back pain over the past 1 week as well.  In the ED, chest x-ray showed evidence of COPD and a right middle lobe pulmonary nodule. Chest CT confirmed right middle lobe nodule concerning for bronchogenic carcinoma. Head CT with numerous hemorrhagic masses consistent with metastatic disease. Patient admitted to hospitalist service. Oncology, palliative care, and pulmonary were consulted.  Subjective:  Patient reports vast improvement in numbness and weakness of L UE/LE today. No complaints at this time, including CP, SOB, abd pain, N/V/D.   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 CT chest reveals possible bronchogenic carcinoma with a right middle lobe pulmonary nodule, and brain imaging reveals multiple hemorrhagic brain metastases and significant edema. IV decadron has been initiated, and patient now with minimal left sided weakness and numbness, and HAs have resolved. Continue pain control and supportive measures.   - Medical oncology and rad oncology are following, plan to taper steroids as outpatient and begin brain radiation once biopsy is obtained. PET scan will be scheduled as outpatient. - Patient agreeable to SNF upon discharge as he lives with his  elderly mother and does not have adequate support at home. PT/OT consulted - Maintain seizure precautions - Palliative care following, patient choosing aggressive treatment at this time and wishes to remain Full Code -I discussed case with Dr. 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 Pathology pending  Urinary Retention -He complained of difficulty initiating urination today for which a bladder scan was checked. Showed a post void residual of 641m, foley catheter placed.  -This is likely related to metastatic disease involving brain.  -Will continue to treat with systemic steroids  Alcohol abuse: Placed on CIWA, no symptoms of withdrawal at this time. Continue thiamine, folate, and multivitamin.   Tobacco abuse: Counseled on cessation. Nicotine patch placed.  COPD?: CT imaging reveals interstitial lung disease, COPD not confirmed at this time. Not on inhalers or home O2. Albuterol nebs as needed if patient becomes SOB. At this time he is breathing comfortably on room air with no wheezing or cough.  Protein calorie malnutrition: Nutrition consulted, recommended oral supplements and diet advancement per SLP recommendation. Continue to encourage good PO intake.   Elevated blood glucose: Secondary to Decadron. CBGs slightly improved. Continue to monitor with sliding scale coverage.  DVT Prophylaxis: SCDs Code Status: Full code Family Communication: None at bedside  Disposition Plan: Will monitor overnight, anticipate discharge in the next 24 hours  Consultants:  Oncology  Radiation Oncology  Pulmonology  Palliative  Procedures:  Endobronchial biopsy scheduled for today  Antibiotics:  None  Objective: Filed Vitals:   11/12/14 1400  BP: 125/64  Pulse: 82  Temp: 98 F (36.7 C)  Resp: 18    Intake/Output Summary (Last 24 hours) at 11/12/14 1550 Last data filed at 11/12/14 1516  Gross per  24 hour  Intake    240 ml  Output   1250 ml   Net  -1010 ml   Filed Weights   11/07/14 0329 11/12/14 0123  Weight: 66.679 kg (147 lb) 62.234 kg (137 lb 3.2 oz)   Exam:   General:  He appears slightly more confused today, although is awake and alert and can follow commands  Neck: Supple, no JVD  Cardiovascular: RRR, no m/r/g  Respiratory: CTAB, normal respiratory effort  Abdomen: Soft, non tender, non distended, +BS  Musculoskeletal: No LE edema, warm, able to move all 4  Neuro: 5/5 strength in all 4, alert and oriented, speech clear and appropriate  Psych: Depressed mood, flat affect   Data Reviewed: Basic Metabolic Panel:  Recent Labs Lab 11/07/14 0233 11/08/14 0555 11/12/14 0558  NA 134* 134* 135  K 3.5 4.1 4.2  CL 106 100* 101  CO2 '22 23 23  '$ GLUCOSE 94 151* 133*  BUN 16 15 23*  CREATININE 0.87 0.79 0.79  CALCIUM 8.9 10.1 9.3   Liver Function Tests: No results for input(s): AST, ALT, ALKPHOS, BILITOT, PROT, ALBUMIN in the last 168 hours. No results for input(s): LIPASE, AMYLASE in the last 168 hours. No results for input(s): AMMONIA in the last 168 hours. CBC:  Recent Labs Lab 11/07/14 0233 11/08/14 0555 11/12/14 0558  WBC 4.4 5.2 5.5  NEUTROABS 2.5  --   --   HGB 13.7 15.7 16.1  HCT 40.5 45.7 46.6  MCV 92.9 90.3 90.7  PLT 184 246 199   Cardiac Enzymes: No results for input(s): CKTOTAL, CKMB, CKMBINDEX, TROPONINI in the last 168 hours. BNP (last 3 results) No results for input(s): BNP in the last 8760 hours.  ProBNP (last 3 results) No results for input(s): PROBNP in the last 8760 hours.  CBG:  Recent Labs Lab 11/11/14 0737 11/11/14 1525 11/11/14 2200 11/12/14 0747 11/12/14 1244  GLUCAP 129* 132* 120* 133* 112*   No results found for this or any previous visit (from the past 240 hour(s)).   Studies: No results found.  Scheduled Meds: . amLODipine  10 mg Oral Daily  . butamben-tetracaine-benzocaine  1 spray Topical Once  . dexamethasone  4 mg Intravenous 6 times per day   . feeding supplement (ENSURE ENLIVE)  237 mL Oral BID BM  . folic acid  1 mg Oral Daily  . insulin aspart  0-9 Units Subcutaneous TID WC  . isosorbide mononitrate  15 mg Oral Daily  . lidocaine  1 application Topical Once  . multivitamin with minerals  1 tablet Oral Daily  . nicotine  21 mg Transdermal Daily  . sodium chloride  3 mL Intravenous Q12H  . thiamine  100 mg Oral Daily   Or  . thiamine  100 mg Intravenous Daily   Continuous Infusions: . 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

## 2014-11-13 ENCOUNTER — Inpatient Hospital Stay (HOSPITAL_COMMUNITY): Payer: Medicare (Managed Care)

## 2014-11-13 DIAGNOSIS — C7931 Secondary malignant neoplasm of brain: Secondary | ICD-10-CM

## 2014-11-13 DIAGNOSIS — C801 Malignant (primary) neoplasm, unspecified: Secondary | ICD-10-CM

## 2014-11-13 DIAGNOSIS — F1010 Alcohol abuse, uncomplicated: Secondary | ICD-10-CM

## 2014-11-13 LAB — BASIC METABOLIC PANEL
Anion gap: 9 (ref 5–15)
BUN: 24 mg/dL — AB (ref 6–20)
CO2: 22 mmol/L (ref 22–32)
CREATININE: 0.72 mg/dL (ref 0.61–1.24)
Calcium: 9.2 mg/dL (ref 8.9–10.3)
Chloride: 102 mmol/L (ref 101–111)
GFR calc Af Amer: >60 mL/min (ref >60)
Glucose, Bld: 128 mg/dL — ABNORMAL HIGH (ref 65–99)
Potassium: 4.4 mmol/L (ref 3.5–5.1)
SODIUM: 133 mmol/L — AB (ref 135–145)

## 2014-11-13 LAB — URINE CULTURE

## 2014-11-13 LAB — GLUCOSE, CAPILLARY
GLUCOSE-CAPILLARY: 209 mg/dL — AB (ref 65–99)
GLUCOSE-CAPILLARY: 196 mg/dL — AB (ref 65–99)
Glucose-Capillary: 124 mg/dL — ABNORMAL HIGH (ref 65–99)
Glucose-Capillary: 130 mg/dL — ABNORMAL HIGH (ref 65–99)

## 2014-11-13 LAB — CBC
HCT: 45.5 % (ref 39.0–52.0)
Hemoglobin: 16.1 g/dL (ref 13.0–17.0)
MCH: 32.1 pg (ref 26.0–34.0)
MCHC: 35.4 g/dL (ref 30.0–36.0)
MCV: 90.8 fL (ref 78.0–100.0)
PLATELETS: 214 10*3/uL (ref 150–400)
RBC: 5.01 MIL/uL (ref 4.22–5.81)
RDW: 12.4 % (ref 11.5–15.5)
WBC: 6.6 10*3/uL (ref 4.0–10.5)

## 2014-11-13 MED ORDER — LORAZEPAM 2 MG/ML IJ SOLN
INTRAMUSCULAR | Status: AC
Start: 2014-11-13 — End: 2014-11-13
  Administered 2014-11-13: 1 mg via INTRAVENOUS
  Filled 2014-11-13: qty 1

## 2014-11-13 MED ORDER — GADOBENATE DIMEGLUMINE 529 MG/ML IV SOLN
15.0000 mL | Freq: Once | INTRAVENOUS | Status: AC | PRN
Start: 2014-11-13 — End: 2014-11-13
  Administered 2014-11-13: 11 mL via INTRAVENOUS

## 2014-11-13 MED ORDER — LORAZEPAM 2 MG/ML IJ SOLN
1.0000 mg | Freq: Once | INTRAMUSCULAR | Status: AC
  Administered 2014-11-13: 1 mg via INTRAVENOUS

## 2014-11-13 MED ORDER — SENNOSIDES-DOCUSATE SODIUM 8.6-50 MG PO TABS
1.0000 | ORAL_TABLET | Freq: Two times a day (BID) | ORAL | Status: DC
  Administered 2014-11-13 – 2014-11-14 (×3): 1 via ORAL
  Filled 2014-11-13 (×4): qty 1

## 2014-11-13 NOTE — Clinical Social Work Note (Signed)
Contacted [facility] to assess for possible SNF bed if pt is dishcarged today since [facility] could not obtain the auth.  Left a message requesting they take a look at patient who they stated they were considering in [facility] system.   Dede Query, LCSW [facility] Worker - Weekend Coverage cell #: [phone]

## 2014-11-13 NOTE — Progress Notes (Signed)
[facility] PROGRESS NOTE  Casey Charles OVF:[id number] DOB: [birth year] DOA: 11/07/2014 PCP: No primary care provider on file.  HPI:  69 year old male with ongoing tobacco abuse (over 30-pack-year smoking history), alcohol use 2-4 beers/ day, and no prior medical history presented to the ED with sudden onset of left UE numbness and weakness that progressed to LLE later in the day, so severe that patient could no longer ambulate. He complained of generalized headaches over the past 1 week, worse on the left side, intermittent without any aggravating or relieving factors. Denies any visual symptoms. Reports worsening back pain over the past 1 week as well.  In the ED, chest x-ray showed evidence of COPD and a right middle lobe pulmonary nodule. Chest CT confirmed right middle lobe nodule concerning for bronchogenic carcinoma. Head CT with numerous hemorrhagic masses consistent with metastatic disease. Patient admitted to hospitalist service. Oncology, palliative care, and pulmonary were consulted.  Subjective:  Patient complaining that right sided hemiparesis possibly worse, having increasing left lower extremity paresthesias.    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 CT chest reveals possible bronchogenic carcinoma with a right middle lobe pulmonary nodule, and brain imaging reveals multiple hemorrhagic brain metastases and significant edema. IV decadron has been initiated, and patient now with minimal left sided weakness and numbness, and HAs have resolved. Continue pain control and supportive measures.   - Medical oncology and rad oncology are following, plan to taper steroids as outpatient and begin brain radiation once biopsy is obtained. PET scan will be scheduled as outpatient. - Patient agreeable to SNF upon discharge as he lives with his elderly mother and  does not have adequate support at home. PT/OT consulted - Maintain seizure precautions - Palliative care following, patient choosing aggressive treatment at this time and wishes to remain Full Code -I discussed case with Dr. 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 Pathology pending -He complains of worsening left sided weakness today, with increasing paresthesias to his left leg. He was found to have urinary retention on 11/12/2014. I am concerned with the possibility of spinal cord involvement. Will further work up with MRI with and without contrast of cervical, thorasic and lumbar spine.   Urinary Retention -On 11/12/2014 he reported difficulty initiating urination today for which a bladder scan was checked. Showed a post void residual of 650m, foley catheter placed.  -This is likely related to metastatic disease involving brain, although there is the possibility of spinal cord involvement as well.  -Will check MRI of back.   Alcohol abuse: Placed on CIWA, no symptoms of withdrawal at this time. Continue thiamine, folate, and multivitamin.   Tobacco abuse: Counseled on cessation. Nicotine patch placed.  COPD: CT imaging reveals interstitial lung disease, COPD not confirmed at this time. Not on inhalers or home O2. Albuterol nebs as needed if patient becomes SOB. At this time he is breathing comfortably on room air with no wheezing or cough.  Protein calorie malnutrition: Nutrition consulted, recommended oral supplements and diet advancement per SLP recommendation. Continue to encourage good PO intake.   Elevated blood glucose: Secondary to Decadron. CBGs slightly improved. Continue to monitor with sliding scale coverage.  DVT Prophylaxis: SCDs Code Status: Full code Family Communication: None at bedside  Disposition Plan: Will monitor overnight, anticipate discharge in the next 24 hours  Consultants:  Oncology  Radiation  Oncology  Pulmonology  Palliative  Procedures:  Endobronchial biopsy scheduled for today  Antibiotics:  None  Objective: Filed Vitals:   11/13/14 0529  BP: 139/51  Pulse: 60  Temp: 98 F (36.7 C)  Resp: 18    Intake/Output Summary (Last 24 hours) at 11/13/14 1132 Last data filed at 11/13/14 0900  Gross per 24 hour  Intake    360 ml  Output   1500 ml  Net  -1140 ml   Filed Weights   11/07/14 0329 11/12/14 0123 11/13/14 0529  Weight: 66.679 kg (147 lb) 62.234 kg (137 lb 3.2 oz) 55.974 kg (123 lb 6.4 oz)   Exam:   General:  He complains of increasing weakness to his left side  Neck: Supple, no JVD  Cardiovascular: RRR, no m/r/g  Respiratory: CTAB, normal respiratory effort  Abdomen: Soft, non tender, non distended, +BS  Musculoskeletal: No LE edema, warm, able to move all 4  Neuro: He has 3/5 muscle strength to his left upper extremity with 4/5 muscle strength to his left lower extremity. There is some decreased sensation to involved extremities    Psych: Depressed mood, flat affect   Data Reviewed: Basic Metabolic Panel:  Recent Labs Lab 11/07/14 0233 11/08/14 0555 11/12/14 0558 11/13/14 0531  NA 134* 134* 135 133*  K 3.5 4.1 4.2 4.4  CL 106 100* 101 102  CO2 '22 23 23 22  '$ GLUCOSE 94 151* 133* 128*  BUN 16 15 23* 24*  CREATININE 0.87 0.79 0.79 0.72  CALCIUM 8.9 10.1 9.3 9.2   Liver Function Tests: No results for input(s): AST, ALT, ALKPHOS, BILITOT, PROT, ALBUMIN in the last 168 hours. No results for input(s): LIPASE, AMYLASE in the last 168 hours. No results for input(s): AMMONIA in the last 168 hours. CBC:  Recent Labs Lab 11/07/14 0233 11/08/14 0555 11/12/14 0558 11/13/14 0531  WBC 4.4 5.2 5.5 6.6  NEUTROABS 2.5  --   --   --   HGB 13.7 15.7 16.1 16.1  HCT 40.5 45.7 46.6 45.5  MCV 92.9 90.3 90.7 90.8  PLT 184 246 199 214   Cardiac Enzymes: No results for input(s): CKTOTAL, CKMB, CKMBINDEX, TROPONINI in the last 168  hours. BNP (last 3 results) No results for input(s): BNP in the last 8760 hours.  ProBNP (last 3 results) No results for input(s): PROBNP in the last 8760 hours.  CBG:  Recent Labs Lab 11/11/14 2200 11/12/14 0747 11/12/14 1244 11/12/14 2224 11/13/14 0756  GLUCAP 120* 133* 112* 171* 124*   Recent Results (from the past 240 hour(s))  Culture, Urine     Status: None   Collection Time: 11/12/14  1:24 AM  Result Value Ref Range Status   Specimen Description URINE, RANDOM  Final   Special Requests NONE  Final   Culture   Final    MULTIPLE SPECIES PRESENT, SUGGEST RECOLLECTION Performed at [facility]    Report Status 11/13/2014 FINAL  Final     Studies: No results found.  Scheduled Meds: . amLODipine  10 mg Oral Daily  . butamben-tetracaine-benzocaine  1 spray Topical Once  . dexamethasone  4 mg Intravenous 6 times per day  . feeding supplement (ENSURE ENLIVE)  237 mL Oral BID BM  . folic acid  1 mg Oral Daily  . insulin aspart  0-9 Units Subcutaneous TID WC  . isosorbide mononitrate  15 mg Oral Daily  . lidocaine  1 application Topical Once  . multivitamin with minerals  1 tablet Oral Daily  . nicotine  21  mg Transdermal Daily  . sodium chloride  3 mL Intravenous Q12H  . thiamine  100 mg Oral Daily   Or  . thiamine  100 mg Intravenous Daily   Continuous Infusions: . lactated ringers 1,000 mL (11/11/14 1143)    Principal Problem:   Acute encephalopathy Active Problems:   Mass of middle lobe of right lung, concern for bronchogeic carcinoma    Metastasis to brain of unknown origin, with vasogenic edema and mass effect    Acute hyponatremia   Accelerated hypertension   Bradycardia   COPD (chronic obstructive pulmonary disease) (HCC)   Current smoker   Acute left-sided weakness, LUE> LLE   Protein-calorie malnutrition, severe (HCC)   ETOH abuse   DNR (do not resuscitate) discussion   Palliative care encounter   Left-sided muscle weakness   Lung  cancer ([place])   Mediastinal lymphadenopathy  Time spent: 66 min  Sanyiah Kanzler, MD  [facility] If 7PM-7AM, please contact night-coverage at [email or web address], password [facility] 11/13/2014, 11:32 AM  LOS: 6 days

## 2014-11-13 NOTE — Clinical Social Work Note (Addendum)
CSW followed up with [facility] regarding the authorization from [place] for pt SNF  Per [facility] they do not have the auth yet and anticipate it on Monday.  They will not take an LOG and will not accept private pay due to their business office being closed.  If pt is ready for d/c on Sunday other SNF's will have to be considered who will accept LOG and or private pay and most already stated no to pt.  [facility] is considering so CSW will follow up with this facility as possibility  CSW will follow up with MD to assess pt discharge plans  .Dede Query, LCSW [facility] Clinical Social Worker - Weekend Coverage cell #: [phone]

## 2014-11-13 NOTE — Clinical Social Work Note (Signed)
CSW called [employer] phone number and it reflected being closed on weekends.  CSW will continue to follow up with pt until discharge  .Dede Query, LCSW [facility] Clinical Social Worker - Weekend Coverage cell #: [phone]

## 2014-11-13 NOTE — Clinical Social Work Note (Signed)
CSW spoke with [facility] who may have a bed for pt tomorrow and believe they can obtain authorization for insurance.  CSW spoke with [facility] who stated they thought they would have authorization tomorrow.  CSW met with pt and his mother to discuss choice for SNF.  CSW explained insurance authorization and the 2 SNF possibilities.  Pt's mother stated that [facility] is first choice for SNF.  Week day CSW will follow up with pt at discharge   .Regina Kujawa, LCSW  [facility] Clinical Social Worker - Weekend Coverage cell #: [phone] 

## 2014-11-14 ENCOUNTER — Ambulatory Visit
Admit: 2014-11-14 | Discharge: 2014-11-14 | Disposition: A | Discharge status: Home / Self Care | Payer: Medicare (Managed Care) | Attending: Radiation Oncology | Admitting: Radiation Oncology

## 2014-11-14 ENCOUNTER — Telehealth: Payer: Self-pay | Admitting: *Deleted

## 2014-11-14 DIAGNOSIS — Z812 Family history of tobacco abuse and dependence: Secondary | ICD-10-CM

## 2014-11-14 DIAGNOSIS — C342 Malignant neoplasm of middle lobe, bronchus or lung: Principal | ICD-10-CM

## 2014-11-14 DIAGNOSIS — C7971 Secondary malignant neoplasm of right adrenal gland: Secondary | ICD-10-CM

## 2014-11-14 DIAGNOSIS — R531 Weakness: Secondary | ICD-10-CM

## 2014-11-14 DIAGNOSIS — C7972 Secondary malignant neoplasm of left adrenal gland: Secondary | ICD-10-CM

## 2014-11-14 LAB — BASIC METABOLIC PANEL
Anion gap: 10 (ref 5–15)
BUN: 27 mg/dL — AB (ref 6–20)
CO2: 22 mmol/L (ref 22–32)
CREATININE: 0.77 mg/dL (ref 0.61–1.24)
Calcium: 9.3 mg/dL (ref 8.9–10.3)
Chloride: 102 mmol/L (ref 101–111)
GFR calc Af Amer: >60 mL/min (ref >60)
GLUCOSE: 140 mg/dL — AB (ref 65–99)
POTASSIUM: 4.2 mmol/L (ref 3.5–5.1)
Sodium: 134 mmol/L — ABNORMAL LOW (ref 135–145)

## 2014-11-14 LAB — CBC
HEMATOCRIT: 43.1 % (ref 39.0–52.0)
Hemoglobin: 15 g/dL (ref 13.0–17.0)
MCH: 31.3 pg (ref 26.0–34.0)
MCHC: 34.8 g/dL (ref 30.0–36.0)
MCV: 89.8 fL (ref 78.0–100.0)
Platelets: 191 10*3/uL (ref 150–400)
RBC: 4.8 MIL/uL (ref 4.22–5.81)
RDW: 12.3 % (ref 11.5–15.5)
WBC: 6.4 10*3/uL (ref 4.0–10.5)

## 2014-11-14 LAB — GLUCOSE, CAPILLARY
GLUCOSE-CAPILLARY: 144 mg/dL — AB (ref 65–99)
Glucose-Capillary: 115 mg/dL — ABNORMAL HIGH (ref 65–99)
Glucose-Capillary: 130 mg/dL — ABNORMAL HIGH (ref 65–99)
Glucose-Capillary: 138 mg/dL — ABNORMAL HIGH (ref 65–99)

## 2014-11-14 MED ORDER — DEXAMETHASONE 2 MG PO TABS: ORAL_TABLET | ORAL | Status: DC

## 2014-11-14 MED ORDER — ALBUTEROL SULFATE (2.5 MG/3ML) 0.083% IN NEBU: 2.5000 mg | INHALATION_SOLUTION | RESPIRATORY_TRACT | Status: DC | PRN

## 2014-11-14 MED ORDER — AMLODIPINE BESYLATE 10 MG PO TABS: 10.0000 mg | ORAL_TABLET | Freq: Every day | ORAL | Status: AC

## 2014-11-14 MED ORDER — ISOSORBIDE MONONITRATE ER 30 MG PO TB24: 15.0000 mg | ORAL_TABLET | Freq: Every day | ORAL | Status: AC

## 2014-11-14 NOTE — Progress Notes (Signed)
Casey Charles   DOB:[birth year]   CW#:[id number]   VVO#:[id number]  Subjective: Patient underwent bronchoscopy and mediastinal lymph node biopsy last Friday. He will start radiation today (per patient), and she is likely going to [facility] healthcare.   Objective:  Filed Vitals:   11/14/14 1400  BP: 141/56  Pulse: 64  Temp: 98.2 F (36.8 C)  Resp: 18    Body mass index is 16.28 kg/(m^2).  Intake/Output Summary (Last 24 hours) at 11/14/14 1709 Last data filed at 11/14/14 0900  Gross per 24 hour  Intake    360 ml  Output    890 ml  Net   -530 ml     Sclerae unicteric  Oropharynx clear  No peripheral adenopathy  Lungs clear -- no rales or rhonchi  Heart regular rate and rhythm  Abdomen benign  MSK no focal spinal tenderness, no peripheral edema  Neuro nonfocal    CBG (last 3)   Recent Labs  11/13/14 2159 11/14/14 0826 11/14/14 1217  GLUCAP 130* 130* 144*     Labs:  Lab Results  Component Value Date   WBC 6.4 11/14/2014   HGB 15.0 11/14/2014   HCT 43.1 11/14/2014   MCV 89.8 11/14/2014   PLT 191 11/14/2014   NEUTROABS 2.5 11/07/2014    '@LASTCHEMISTRY'$ @  Urine Studies No results for input(s): UHGB, CRYS in the last 72 hours.  Invalid input(s): UACOL, UAPR, USPG, UPH, UTP, UGL, UKET, UBIL, UNIT, UROB, Mayo, UEPI, UWBC, Duwayne Heck [place], [place]  Basic Metabolic Panel:  Recent Labs Lab 11/08/14 0555 11/12/14 0558 11/13/14 0531 11/14/14 0527  NA 134* 135 133* 134*  K 4.1 4.2 4.4 4.2  CL 100* 101 102 102  CO2 '23 23 22 22  '$ GLUCOSE 151* 133* 128* 140*  BUN 15 23* 24* 27*  CREATININE 0.79 0.79 0.72 0.77  CALCIUM 10.1 9.3 9.2 9.3   GFR Estimated Creatinine Clearance: 70 mL/min (by C-G formula based on Cr of 0.77). Liver Function Tests: No results for input(s): AST, ALT, ALKPHOS, BILITOT, PROT, ALBUMIN in the last 168 hours. No results for input(s): LIPASE, AMYLASE in the last 168 hours. No results for input(s): AMMONIA in the last 168  hours. Coagulation profile No results for input(s): INR, PROTIME in the last 168 hours.  CBC:  Recent Labs Lab 11/08/14 0555 11/12/14 0558 11/13/14 0531 11/14/14 0527  WBC 5.2 5.5 6.6 6.4  HGB 15.7 16.1 16.1 15.0  HCT 45.7 46.6 45.5 43.1  MCV 90.3 90.7 90.8 89.8  PLT 246 199 214 191   Cardiac Enzymes: No results for input(s): CKTOTAL, CKMB, CKMBINDEX, TROPONINI in the last 168 hours. BNP: Invalid input(s): POCBNP CBG:  Recent Labs Lab 11/13/14 1218 11/13/14 1732 11/13/14 2159 11/14/14 0826 11/14/14 1217  GLUCAP 209* 196* 130* 130* 144*   D-Dimer No results for input(s): DDIMER in the last 72 hours. Hgb A1c No results for input(s): HGBA1C in the last 72 hours. Lipid Profile No results for input(s): CHOL, HDL, LDLCALC, TRIG, CHOLHDL, LDLDIRECT in the last 72 hours. Thyroid function studies No results for input(s): TSH, T4TOTAL, T3FREE, THYROIDAB in the last 72 hours.  Invalid input(s): FREET3 Anemia work up No results for input(s): VITAMINB12, FOLATE, FERRITIN, TIBC, IRON, RETICCTPCT in the last 72 hours. Microbiology Recent Results (from the past 240 hour(s))  Culture, Urine     Status: None   Collection Time: 11/12/14  1:24 AM  Result Value Ref Range Status   Specimen Description URINE, RANDOM  Final  Special Requests NONE  Final   Culture   Final    MULTIPLE SPECIES PRESENT, SUGGEST RECOLLECTION Performed at [facility]    Report Status 11/13/2014 FINAL  Final    PATHOLOGY REPORT Adequacy Reason Satisfactory For Evaluation. Diagnosis 11/11/2014 FINE NEEDLE ASPIRATION, ENDOSCOPIC, EBUS, # 7 NODE, SUBCARINAL,(SPECIMEN 1 OF 1 COLLECTED 11/11/14): ABUNDANT NECROSIS AND RARE MALIGNANT CELLS CONSISENT WITH NON SMALL CELL CARCINOMA. DR. Tresa Moore AGREES. DISCUSSED WITH DR. Lisbeth Renshaw ON 11/14/14. Preliminary Diagnosis Intraoperative Diagnosis: Malignant cells present. Extensive necrosis. [facility])   Studies:  Mr Total Spine Mets Screening  11/13/2014    CLINICAL DATA:  69 year old male with metastatic lung cancer. Sudden onset upper extremity numbness and weakness progress into left lower extremity later in the day. Subsequent encounter.  EXAM: MRI TOTAL SPINE WITHOUT AND WITH CONTRAST  TECHNIQUE: Multisequence MR imaging of the spine from the cervical spine to the sacrum was performed prior to and following IV contrast administration for evaluation of spinal metastatic disease.  CONTRAST:  25m MULTIHANCE GADOBENATE DIMEGLUMINE 529 MG/ML IV SOLN  COMPARISON:  No comparison MR of the spine. Comparison head CT 11/07/2014. Comparison CT scan of the chest, abdomen and pelvis 11/07/2014.  FINDINGS: Exam is motion degraded. The patient was uncomfortable throughout the examination and moving. Only sagittal imaging was able to be performed.  Multiple intracranial metastatic lesions are noted as on recent CT.  Thoracic malignancy as noted on recent CT.  Cervical Findings:  No osseous destructive lesion or abnormal cord enhancing lesion to suggest metastatic disease at the level of the cervical spine.  Cervical spondylotic changes with bulges/protrusions contributing to spinal stenosis most prominent C3-4 thru C5-6 where there is cord contact and deformity most prominent C4-5 level. Axial images not obtained.  Thoracic Findings:  No osseous destructive lesion or abnormal cord enhancing lesion to suggest metastatic disease at the level of the thoracic spine.  T2-3 minimal bulge.  T6-7 mild bulge/spur with mild narrowing ventral aspect of the thecal sac and minimal posterior cord deflection.  T10-11 minimal bulge.  Lumbar Findings:  Sacralized L5 vertebral body with rudimentary disc at the L5-S1 level.  Abnormal signal within the L3, L4 and L5 vertebral body felt to be related to discogenic changes rather than metastatic disease. Overall, no enhancing lesion to suggest metastatic disease involving the lumbar spine.  L3-4 and L4-5 significant facet joint degenerative changes  and disc degeneration. Minimal anterior slip L3 and L4. Bulge and osteophyte.  L3-4 moderate spinal stenosis, moderate to marked right-sided and marked to severe left-sided foraminal narrowing with mass effect upon the exiting L3 nerve roots greater on the left.  L4-5 mild spinal stenosis. Moderate right-sided and mild-to-moderate left-sided foraminal narrowing with slight encroachment upon the course exiting right L4 nerve root.  L2-3 mild bulge.  IMPRESSION: Exam is motion degraded. The patient was uncomfortable throughout the examination and moving. Only sagittal imaging was able to be performed.  Multiple intracranial metastatic lesions are noted as on recent CT.  Thoracic malignancy as noted on recent CT.  No osseous destructive lesion or abnormal cord enhancing lesion to suggest metastatic disease involving the cervical, thoracic or lumbar spine.  Degenerative changes including:  CERVICAL:  Cervical spondylotic changes with bulges/protrusions contributing to spinal stenosis most prominent C3-4 thru C5-6 where there is cord contact and deformity most prominent C4-5 level. Axial images not obtained.  THORACIC:  T2-3 minimal bulge.  T6-7 mild bulge/spur with mild narrowing ventral aspect of the thecal sac and minimal posterior cord deflection.  T10-11 minimal bulge.  LUMBAR:  Sacralized L5 vertebral body with rudimentary disc at the L5-S1 level.  Abnormal signal within the L3, L4 and L5 vertebral body felt to be related to discogenic changes rather than metastatic disease.  L3-4 and L4-5 significant facet joint degenerative changes and disc degeneration. Minimal anterior slip L3 and L4. Bulge and osteophyte.  L3-4 moderate spinal stenosis, moderate to marked right-sided and marked to severe left-sided foraminal narrowing with mass effect upon the exiting L3 nerve roots greater on the left.  L4-5 mild spinal stenosis. Moderate right-sided and mild-to-moderate left-sided foraminal narrowing with slight encroachment  upon the course exiting right L4 nerve root.   Electronically Signed   By: Genia Del M.D.   On: 11/13/2014 15:20    Assessment: 69 year old African-American male, with heavy smoking history, presented with left-sided weakness. CT scan reviewed a right middle lobe lung lesion, right hilar and mediastinal adenopathy, bilateral adrenal and diffuse brain metastasis. Image findings are most consistent with metastatic lung cancer.  1. Metastatic non-small cell lung cancer 2. Brain metastasis 3. COPD, tobacco and alcohol abuse 4. Malnutrition   Plan: -I discussed with pathologist Dr. Saralyn Pilar today. Unfortunately his station 7 lymph node biopsy almost necrotic tissue, there are only a few malignant cells, probably non-small cell carcinoma. He discussed with Dr. Tammi Klippel also. -His biopsy sample would not be sufficient for any molecular testing. -I agree with whole brain radiation, which he will start soon.  -Giving his overall poor performance status, he developed diminished social support, he may not be a good candidate for systemic chemotherapy. I will hold on further biopsy at this point, and see how he does with the radiation first. -I'll see him back in my clinic in 2 weeks. We'll set up this appointment.   Truitt Merle, MD 11/14/2014  5:09 PM

## 2014-11-14 NOTE — Progress Notes (Signed)
[facility] states they will take patient tonight, facility is awaiting insurance authorization but states they will take patient today.  Edwyna Shell, LCSW Clinical Social Worker

## 2014-11-14 NOTE — Progress Notes (Signed)
Insurance auth received by SNF.  Pt able to transfer when ready.    Edwyna Shell, LCSW Clinical Social Worker

## 2014-11-14 NOTE — Telephone Encounter (Signed)
Dr. Lisbeth Renshaw called and stated yes to treat patient today even thought pathology is still pending, thanked MD and Called and spoke with Melissa and infomred to treat patient today 1:40 PM

## 2014-11-14 NOTE — Progress Notes (Signed)
CSW spoke w receiving SNF, [facility] awaiting approval from insurance company, is able to take patient admissions 24/7, must wait until insurance approves.  Edwyna Shell, LCSW Clinical Social Worker

## 2014-11-14 NOTE — Telephone Encounter (Signed)
Called [facility],spoke with RN [place], asked to have Dr. Benjie Karvonen call about whether patyient to have rad tx or not today, his bx is still pending, spoke with Faith, RT Therapist,  I called before lunch around 1100am, no call back as yet, 12:37 PM

## 2014-11-14 NOTE — Progress Notes (Signed)
[facility] arrived to transport patient. Paperwork provided. Family at bedside at time of transport. All belongings sent with [facility]. Patient understands plan of care, eager to get to facility.

## 2014-11-14 NOTE — Discharge Summary (Signed)
Physician Discharge Summary  Casey Charles QBH:[id number] DOB: [birth year] DOA: 11/07/2014  PCP: No primary care provider on file.  Admit date: 11/07/2014 Discharge date: 11/14/2014  Time spent: 35 minutes  Recommendations for Outpatient Follow-up:   1. Please follow up with care team at [facility] upon discharge, he was set up to follow up Dr Burr Medico and Dr Lisbeth Renshaw  2. Follow up on Pathology results from Bronch Biopsy 3. Please follow up with oncology team regarding scheduling PET scan, endobronchial biopsy results, and other plans of care as outpatient.   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, with vasogenic edema and mass effect    Acute hyponatremia   Accelerated hypertension   Bradycardia   COPD (chronic obstructive pulmonary disease) (HCC)   Current smoker   Acute left-sided weakness, LUE> LLE   Protein-calorie malnutrition, severe (HCC)   ETOH abuse   DNR (do not resuscitate) discussion   Palliative care encounter   Left-sided muscle weakness   Lung cancer ([place])   Mediastinal lymphadenopathy   Discharge Condition: Stable  Diet recommendation: Heart Healthy   Filed Weights   11/07/14 0329 11/12/14 0123 11/13/14 0529  Weight: 66.679 kg (147 lb) 62.234 kg (137 lb 3.2 oz) 55.974 kg (123 lb 6.4 oz)    History of present illness: 69 year old male with ongoing tobacco abuse (over 30-pack-year smoking history), alcohol use 2-4 beers/ day, and no prior medical history presented to the ED with sudden onset of left UE numbness and weakness that progressed to LLE later in the day, so severe that patient could no longer ambulate. He complained of generalized headaches over the past 1 week prior to admission, worse on the left side, intermittent without any aggravating or relieving factors. Denies any visual symptoms.  Reports worsening back pain over the past 1 week as well.  In the ED, chest x-ray showed evidence of COPD and a right middle lobe pulmonary nodule. Chest CT confirmed right middle lobe nodule concerning for bronchogenic carcinoma. Head CT with numerous hemorrhagic masses consistent with metastatic disease. Patient admitted to hospitalist service. Oncology, palliative care, and pulmonary were consulted.  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 CT chest reveals possible bronchogenic carcinoma with a right middle lobe pulmonary nodule, and brain imaging reveals multiple hemorrhagic brain metastases and significant edema. IV decadron was been initiated, and patient now with minimal left sided weakness and numbness, and HAs have resolved. Continued pain control and supportive measures as outpatient.  - Medical oncology and rad oncology are following, plan to taper steroids as outpatient and begin brain radiation once biopsy is obtained. PET scan will be scheduled as outpatient. - Patient agreeable to SNF upon discharge as he lives with his elderly mother and does not have adequate support at home. PT/OT consulted - Maintain seizure precautions - Palliative care following, patient choosing aggressive treatment at this time and wishes to remain Full Code - I discussed case with Dr. 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 Pathology pending  Urinary Retention: On 11/12/2014 he reported difficulty initiating urination, a bladder scan was checked. Showed a post void residual of 643m, foley catheter placed. This is likely related to metastatic disease involving brain. MRI spine negative  for spinal mets. U/A negative for UTI. Will discharge to SNF with catheter in place.   Alcohol abuse: Placed on CIWA upon admission, no symptoms  of withdrawal during hospitalization. Was given IVF, multivitamin, and thiamine during admission. Encouraged alcohol cessation upon discharge.   Tobacco abuse: Counseled on cessation. Nicotine patch placed.  COPD: CT imaging reveals interstitial lung disease, COPD not confirmed at this time. Not on inhalers or home O2. At this time he is breathing comfortably on room air with no wheezing or cough. He is stable for discharge and outpatient follow up/management.  Protein calorie malnutrition: Nutrition consulted, recommended oral supplements and diet advancement per SLP recommendation. Patient encouraged to continue good PO intake.   Elevated blood glucose: Secondary to Decadron. CBGs slightly improved with SSI. Will need to continue SSI for the duration of steroid course to help control blood sugars. Close daily monitoring recommended.  Procedures:  Endobronchial biopsy  Consultations:  Oncology  Radiation Oncology  Pulmonology  Palliative   Discharge Exam: Filed Vitals:   11/14/14 1400  BP: 141/56  Pulse: 64  Temp: 98.2 F (36.8 C)  Resp: 18   General: NAD, cachectic Neck: Supple, no JVD Cardiovascular: RRR, no m/r/g Respiratory: CTAB, normal respiratory effort Abdomen: Soft, non tender, +BS Musculoskeletal: No LE edema, able to move all 4 Neuro: 4/5 strength in L UE/LE, 5/5 in R UE/LE. Some decreased sensation to L extremities Psych: Depressed mood, flat affect  Discharge Instructions   Discharge Instructions    Call MD for:  difficulty breathing, headache or visual disturbances    Complete by:  As directed      Call MD for:  extreme fatigue    Complete by:  As directed      Call MD for:  hives    Complete by:  As directed      Call MD for:  persistant dizziness or light-headedness    Complete by:  As directed      Call MD for:  persistant nausea and vomiting    Complete by:  As directed      Call MD for:  redness, tenderness, or signs of infection (pain,  swelling, redness, odor or green/yellow discharge around incision site)    Complete by:  As directed      Call MD for:  severe uncontrolled pain    Complete by:  As directed      Call MD for:  temperature >100.4    Complete by:  As directed      Call MD for:    Complete by:  As directed      Diet - low sodium heart healthy    Complete by:  As directed      Increase activity slowly    Complete by:  As directed           Current Discharge Medication List    START taking these medications   Details  albuterol (PROVENTIL) (2.5 MG/3ML) 0.083% nebulizer solution Take 3 mLs (2.5 mg total) by nebulization every 2 (two) hours as needed for wheezing or shortness of breath. Qty: 75 mL, Refills: 12    amLODipine (NORVASC) 10 MG tablet Take 1 tablet (10 mg total) by mouth daily. Qty: 30 tablet, Refills: 0    dexamethasone (DECADRON) 2 MG tablet Take 4 mg PO BID x 5 days, followed by 2 mg PO BID x 5 days, followed by 1 mg PO BID for 5 days, followed by 1 mg PO q daily x 5  days then stop. Qty Suf Qty: 40 tablet, Refills: 0    isosorbide mononitrate (IMDUR) 30 MG 24 hr tablet Take 0.5 tablets (15 mg total) by mouth daily. Qty: 30 tablet, Refills: 0      CONTINUE these medications which have NOT CHANGED   Details  acetaminophen (PAIN RELIEF) 325 MG tablet Take 650 mg by mouth every 6 (six) hours as needed for moderate pain.    Multiple Vitamin (MULTIVITAMIN WITH MINERALS) TABS tablet Take 1 tablet by mouth daily.      STOP taking these medications     azithromycin (ZITHROMAX) 250 MG tablet      cyclobenzaprine (FLEXERIL) 10 MG tablet      predniSONE (DELTASONE) 20 MG tablet      traMADol (ULTRAM) 50 MG tablet        No Known Allergies Follow-up Information    Follow up with Truitt Merle, MD In 1 week.   Specialties:  Hematology, Oncology   Contact information:   [place] [address] [phone]       Follow up with Kyung Rudd, MD In 1 week.   Specialty:   Radiation Oncology   Contact information:   [address] [place] [address] [phone]      The results of significant diagnostics from this hospitalization (including imaging, microbiology, ancillary and laboratory) are listed below for reference.    Significant Diagnostic Studies: Dg Chest 2 View  11/07/2014   CLINICAL DATA:  Abnormal head CT. Smoking history. Evaluate for cancer.  EXAM: CHEST  2 VIEW  COMPARISON:  08/26/2014  FINDINGS: Right middle lobe pulmonary nodule, most discrete in the lateral projection and measuring 16 mm.  Borderline cardiomegaly. Saber trachea correlating with COPD changes. Hyperinflation, apical hyperinflation, and diffuse interstitial coarsening. Focal scarring with calcification at the right apex.  IMPRESSION: 1. Right middle lobe pulmonary nodule, correlate with pending chest CT. 2. Chronic lung disease including COPD.   Electronically Signed   By: Monte Fantasia M.D.   On: 11/07/2014 05:20   Ct Head Wo Contrast  11/07/2014   CLINICAL DATA:  Left-sided numbness from head to feet upon awakening. Left-sided headache.  EXAM: CT HEAD WITHOUT CONTRAST  TECHNIQUE: Contiguous axial images were obtained from the base of the skull through the vertex without intravenous contrast.  COMPARISON:  02/29/2012  FINDINGS: Skull and Sinuses:Negative for fracture or destructive process. The mastoids, middle ears, and imaged paranasal sinuses are clear.  Orbits: No acute abnormality.  Brain: Multiple high-density masses, at least 11 in the right cerebral hemisphere, 19 in the left cerebral hemisphere, and 6 infratentorial (including a 19 mm mass in the lower pons). Variable degrees of surrounding edema with local mass effect but no shift or herniation. No subarachnoid or intraventricular hemorrhage. No evidence of acute infarct. There is a background of atrophy and chronic small vessel disease.  Critical Value/emergent results were called by telephone at the time of  interpretation on 11/07/2014 at 3:38 am to Dr. Rolland Porter , who verbally acknowledged these results.  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 Associated variable vasogenic edema with mild local mass effect.   Electronically Signed   By: Monte Fantasia M.D.   On: 11/07/2014 03:44   Ct Chest W Contrast  11/07/2014   CLINICAL DATA:  Possible metastatic disease.  Brain masses  EXAM: CT CHEST, ABDOMEN, AND PELVIS WITH CONTRAST  TECHNIQUE: Multidetector CT imaging of the chest, abdomen and pelvis was performed following the  standard protocol during bolus administration of intravenous contrast.  CONTRAST:  79m OMNIPAQUE IOHEXOL 300 MG/ML SOLN, 1024mOMNIPAQUE IOHEXOL 300 MG/ML SOLN  COMPARISON:  None.  FINDINGS: CT CHEST FINDINGS  THORACIC INLET/BODY WALL:  No acute abnormality.  MEDIASTINUM:  Normal heart size. No pericardial effusion. Diffuse atherosclerosis, including the coronary arteries.  Right upper paratracheal, left lower paratracheal, subcarinal, and right hilar lymphadenopathy with central low-density necrotic appearance. The largest node is sub- carinal, 4 cm in diameter. Presumed primary in the right middle lobe measuring 24 mm in diameter. No additional suspicious pulmonary nodules.  LUNG WINDOWS:  Reticulation the lower lungs which is unusual, favoring an interstitial lung disease without honeycombing. Extensive right upper lobe scarring and volume loss with airway obstruction and bronchiectasis. Multiple calcified granulomas.  Saber trachea and hyperinflation.  OSSEOUS:  No acute fracture.  No suspicious lytic or blastic lesions.  CT ABDOMEN PELVIS FINDINGS  Hepatobiliary: No focal liver abnormality.No evidence of biliary obstruction or stone.  Pancreas: Unremarkable.  Spleen: Unremarkable.  Adrenals/Urinary Tract: Bilateral adrenal nodules, heterogeneous in appearance and measuring up to 25 mm on the right.  No hydronephrosis or stone.  Distended and thick walled  bladder, likely chronic outlet obstruction.  Reproductive:Prostatomegaly, projecting into the bladder base.  Stomach/Bowel:  No obstruction. No appendicitis.  Vascular/Lymphatic: No acute vascular abnormality. No definitive malignant adenopathy.  Peritoneal: No ascites or pneumoperitoneum.  Musculoskeletal: No acute abnormalities. Advanced lower lumbar degenerative disc and facet disease.  IMPRESSION: 1. Intrathoracic malignancy, likely bronchogenic carcinoma. There is a right middle lobe pulmonary nodule with right hilar and bilateral mediastinal lymphadenopathy. Bilateral adrenal masses, presumed metastases. 2. Interstitial lung disease. Granulomatous right upper lobe scarring, likely postinfectious.   Electronically Signed   By: JoMonte Fantasia.D.   On: 11/07/2014 05:51   Ct Abdomen Pelvis W Contrast  11/07/2014   CLINICAL DATA:  Possible metastatic disease.  Brain masses  EXAM: CT CHEST, ABDOMEN, AND PELVIS WITH CONTRAST  TECHNIQUE: Multidetector CT imaging of the chest, abdomen and pelvis was performed following the standard protocol during bolus administration of intravenous contrast.  CONTRAST:  5068mMNIPAQUE IOHEXOL 300 MG/ML SOLN, 100m73mNIPAQUE IOHEXOL 300 MG/ML SOLN  COMPARISON:  None.  FINDINGS: CT CHEST FINDINGS  THORACIC INLET/BODY WALL:  No acute abnormality.  MEDIASTINUM:  Normal heart size. No pericardial effusion. Diffuse atherosclerosis, including the coronary arteries.  Right upper paratracheal, left lower paratracheal, subcarinal, and right hilar lymphadenopathy with central low-density necrotic appearance. The largest node is sub- carinal, 4 cm in diameter. Presumed primary in the right middle lobe measuring 24 mm in diameter. No additional suspicious pulmonary nodules.  LUNG WINDOWS:  Reticulation the lower lungs which is unusual, favoring an interstitial lung disease without honeycombing. Extensive right upper lobe scarring and volume loss with airway obstruction and bronchiectasis.  Multiple calcified granulomas.  Saber trachea and hyperinflation.  OSSEOUS:  No acute fracture.  No suspicious lytic or blastic lesions.  CT ABDOMEN PELVIS FINDINGS  Hepatobiliary: No focal liver abnormality.No evidence of biliary obstruction or stone.  Pancreas: Unremarkable.  Spleen: Unremarkable.  Adrenals/Urinary Tract: Bilateral adrenal nodules, heterogeneous in appearance and measuring up to 25 mm on the right.  No hydronephrosis or stone.  Distended and thick walled bladder, likely chronic outlet obstruction.  Reproductive:Prostatomegaly, projecting into the bladder base.  Stomach/Bowel:  No obstruction. No appendicitis.  Vascular/Lymphatic: No acute vascular abnormality. No definitive malignant adenopathy.  Peritoneal: No ascites or pneumoperitoneum.  Musculoskeletal: No acute abnormalities. Advanced lower lumbar degenerative disc and facet  disease.  IMPRESSION: 1. Intrathoracic malignancy, likely bronchogenic carcinoma. There is a right middle lobe pulmonary nodule with right hilar and bilateral mediastinal lymphadenopathy. Bilateral adrenal masses, presumed metastases. 2. Interstitial lung disease. Granulomatous right upper lobe scarring, likely postinfectious.   Electronically Signed   By: Monte Fantasia M.D.   On: 11/07/2014 05:51   Mr Total Spine Mets Screening  11/13/2014   CLINICAL DATA:  69 year old male with metastatic lung cancer. Sudden onset upper extremity numbness and weakness progress into left lower extremity later in the day. Subsequent encounter.  EXAM: MRI TOTAL SPINE WITHOUT AND WITH CONTRAST  TECHNIQUE: Multisequence MR imaging of the spine from the cervical spine to the sacrum was performed prior to and following IV contrast administration for evaluation of spinal metastatic disease.  CONTRAST:  85m MULTIHANCE GADOBENATE DIMEGLUMINE 529 MG/ML IV SOLN  COMPARISON:  No comparison MR of the spine. Comparison head CT 11/07/2014. Comparison CT scan of the chest, abdomen and pelvis  11/07/2014.  FINDINGS: Exam is motion degraded. The patient was uncomfortable throughout the examination and moving. Only sagittal imaging was able to be performed.  Multiple intracranial metastatic lesions are noted as on recent CT.  Thoracic malignancy as noted on recent CT.  Cervical Findings:  No osseous destructive lesion or abnormal cord enhancing lesion to suggest metastatic disease at the level of the cervical spine.  Cervical spondylotic changes with bulges/protrusions contributing to spinal stenosis most prominent C3-4 thru C5-6 where there is cord contact and deformity most prominent C4-5 level. Axial images not obtained.  Thoracic Findings:  No osseous destructive lesion or abnormal cord enhancing lesion to suggest metastatic disease at the level of the thoracic spine.  T2-3 minimal bulge.  T6-7 mild bulge/spur with mild narrowing ventral aspect of the thecal sac and minimal posterior cord deflection.  T10-11 minimal bulge.  Lumbar Findings:  Sacralized L5 vertebral body with rudimentary disc at the L5-S1 level.  Abnormal signal within the L3, L4 and L5 vertebral body felt to be related to discogenic changes rather than metastatic disease. Overall, no enhancing lesion to suggest metastatic disease involving the lumbar spine.  L3-4 and L4-5 significant facet joint degenerative changes and disc degeneration. Minimal anterior slip L3 and L4. Bulge and osteophyte.  L3-4 moderate spinal stenosis, moderate to marked right-sided and marked to severe left-sided foraminal narrowing with mass effect upon the exiting L3 nerve roots greater on the left.  L4-5 mild spinal stenosis. Moderate right-sided and mild-to-moderate left-sided foraminal narrowing with slight encroachment upon the course exiting right L4 nerve root.  L2-3 mild bulge.  IMPRESSION: Exam is motion degraded. The patient was uncomfortable throughout the examination and moving. Only sagittal imaging was able to be performed.  Multiple intracranial  metastatic lesions are noted as on recent CT.  Thoracic malignancy as noted on recent CT.  No osseous destructive lesion or abnormal cord enhancing lesion to suggest metastatic disease involving the cervical, thoracic or lumbar spine.  Degenerative changes including:  CERVICAL:  Cervical spondylotic changes with bulges/protrusions contributing to spinal stenosis most prominent C3-4 thru C5-6 where there is cord contact and deformity most prominent C4-5 level. Axial images not obtained.  THORACIC:  T2-3 minimal bulge.  T6-7 mild bulge/spur with mild narrowing ventral aspect of the thecal sac and minimal posterior cord deflection.  T10-11 minimal bulge.  LUMBAR:  Sacralized L5 vertebral body with rudimentary disc at the L5-S1 level.  Abnormal signal within the L3, L4 and L5 vertebral body felt to be related to discogenic changes  rather than metastatic disease.  L3-4 and L4-5 significant facet joint degenerative changes and disc degeneration. Minimal anterior slip L3 and L4. Bulge and osteophyte.  L3-4 moderate spinal stenosis, moderate to marked right-sided and marked to severe left-sided foraminal narrowing with mass effect upon the exiting L3 nerve roots greater on the left.  L4-5 mild spinal stenosis. Moderate right-sided and mild-to-moderate left-sided foraminal narrowing with slight encroachment upon the course exiting right L4 nerve root.   Electronically Signed   By: Genia Del M.D.   On: 11/13/2014 15:20   Microbiology: Recent Results (from the past 240 hour(s))  Culture, Urine     Status: None   Collection Time: 11/12/14  1:24 AM  Result Value Ref Range Status   Specimen Description URINE, RANDOM  Final   Special Requests NONE  Final   Culture   Final    MULTIPLE SPECIES PRESENT, SUGGEST RECOLLECTION Performed at [facility]    Report Status 11/13/2014 FINAL  Final    Labs: Basic Metabolic Panel:  Recent Labs Lab 11/08/14 0555 11/12/14 0558 11/13/14 0531 11/14/14 0527  NA  134* 135 133* 134*  K 4.1 4.2 4.4 4.2  CL 100* 101 102 102  CO2 '23 23 22 22  '$ GLUCOSE 151* 133* 128* 140*  BUN 15 23* 24* 27*  CREATININE 0.79 0.79 0.72 0.77  CALCIUM 10.1 9.3 9.2 9.3   Liver Function Tests: No results for input(s): AST, ALT, ALKPHOS, BILITOT, PROT, ALBUMIN in the last 168 hours. No results for input(s): LIPASE, AMYLASE in the last 168 hours. No results for input(s): AMMONIA in the last 168 hours. CBC:  Recent Labs Lab 11/08/14 0555 11/12/14 0558 11/13/14 0531 11/14/14 0527  WBC 5.2 5.5 6.6 6.4  HGB 15.7 16.1 16.1 15.0  HCT 45.7 46.6 45.5 43.1  MCV 90.3 90.7 90.8 89.8  PLT 246 199 214 191   Cardiac Enzymes: No results for input(s): CKTOTAL, CKMB, CKMBINDEX, TROPONINI in the last 168 hours. BNP: BNP (last 3 results) No results for input(s): BNP in the last 8760 hours.  ProBNP (last 3 results) No results for input(s): PROBNP in the last 8760 hours.  CBG:  Recent Labs Lab 11/13/14 1218 11/13/14 1732 11/13/14 2159 11/14/14 0826 11/14/14 1217  GLUCAP 209* 196* 130* 130* 144*   Signed:  Kelvin Cellar, MD  [facility] 11/14/2014, 5:19 PM   Addendum  I personally evaluated patient on 11/14/2014 and agree with the above findings. Mr. Rollene Fare is a pleasant 69 year old gentleman with no static and past medical history who was admitted to the medicine service on 11/07/2014 when he presented with complaints of left upper and left lower extremity weakness is associate with paresthesias. He was further worked up with a CT scan of brain without contrast that revealed numerous metastasis with associated vasogenic edema and mild mass effect. He was started on IV Decadron. Further workup with CT scan of lungs revealed findings consistent with bronchogenic carcinoma. Pulmonary critical care medicine was consulted as he underwent bronchoscopy on 11/11/2014 to obtain tissue biopsy. He developed urinary retention during this hospitalization and was further  worked up with MR of total spine to assess for possible spinal cord involvement. This study did not reveal osseous destructive lesions or abnormal cord enhancing lesions to suspect metastatic disease. Dr Burr Medico of Medical Oncology and Dr Lisbeth Renshaw of Radiation Oncology were consulted. Patient not felt to be a good candidate for systemic therapy. He was started on whole brain radiation on 11/14/2014. Due to severe deconditioning and  weakness along with family members and ability to care for him at home he was discharged to skilled nursing facility for acute rehabilitation. On the day of admission. Stable, was sitting at bedside chair. He did not have change in his neurologic findings, having 3-5 muscle strength to his left upper extremity with 4-5 muscle strength to left lower extremity. He reported ongoing paresthesias to left side.

## 2014-11-14 NOTE — Progress Notes (Signed)
Physical Therapy Treatment Patient Details Name: Casey Charles MRN: [id number] DOB: 1945-02-12 Today's Date: 11/14/2014    History of Present Illness 69 year old male with ongoing tobacco use (over 30-pack-year smoking history), ongoing alcohol use 2-4 beers/ day, no prior medical history presented to the ED with sudden onset of left upper extremity weakness and numbness earlier on the day of admission when he woke up to go to the bathroom. This progressed to involve his lower leg and was unable to ambulate. He complained of generalized headaches over the past 1 week prior to adm worse on the left side,  back pain    PT Comments    Pt cooperative with therapy, progressing slowly but continues to need STSNF;   Follow Up Recommendations  Supervision/Assistance - 24 hour;SNF     Equipment Recommendations  None recommended by PT    Recommendations for Other Services       Precautions / Restrictions Precautions Precautions: Fall Restrictions Weight Bearing Restrictions: No    Mobility  Bed Mobility Overal bed mobility: Needs Assistance Bed Mobility: Supine to Sit     Supine to sit: Supervision     General bed mobility comments: for safety  Transfers Overall transfer level: Needs assistance   Transfers: Sit to/from Stand;Stand Pivot Transfers Sit to Stand(x3): Min assist;Min guard;+2 safety/equipment Stand pivot transfers: Mod assist;+2 physical assistance;+2 safety/equipment       General transfer comment: assist for transition, and balance once in standing  Ambulation/Gait         Gait velocity: pre-gait activities in standing with +2 mod for balance Continues to demo decr proprioception, ataxic movement LLE       Stairs            Wheelchair Mobility    Modified Rankin (Stroke Patients Only)       Balance   Sitting-balance support: Bilateral upper extremity supported Sitting balance-Leahy Scale: Fair Sitting balance - Comments: pt able to  maintain midline without cues today   Standing balance support: Bilateral upper extremity supported;No upper extremity supported;During functional activity Standing balance-Leahy Scale: Poor               High level balance activites: Side stepping      Cognition Arousal/Alertness: Awake/alert Behavior During Therapy: Flat affect;Impulsive Overall Cognitive Status: No family/caregiver present to determine baseline cognitive functioning               Problem Solving: Slow processing;Difficulty sequencing;Requires verbal cues;Requires tactile cues General Comments: pt with delayed response time    Exercises   LLE LAQs x 10 reps with emphasis on speed of movement and control   General Comments        Pertinent Vitals/Pain Pain Assessment: No/denies pain    Home Living                      Prior Function            PT Goals (current goals can now be found in the care plan section) Acute Rehab PT Goals Patient Stated Goal: to walk PT Goal Formulation: With patient Time For Goal Achievement: 11/18/14 Potential to Achieve Goals: Fair Progress towards PT goals: Progressing toward goals    Frequency  Min 3X/week    PT Plan Current plan remains appropriate    Co-evaluation             End of Session Equipment Utilized During Treatment: Gait belt Activity Tolerance: Patient tolerated treatment well Patient left:  in chair;with call bell/phone within reach;with chair alarm set     Time: 1015-1030 PT Time Calculation (min) (ACUTE ONLY): 15 min  Charges:  $Therapeutic Activity: 8-22 mins                    G Codes:      Casey Charles 12-13-14, 11:42 AM

## 2014-11-14 NOTE — Progress Notes (Signed)
   D/w Dr Coralyn Pear  - ok for pccm to sign off   Dr. Brand Males, M.D., [facility].C.P Pulmonary and Critical Care Medicine Staff Physician [facility] Pager: [phone], If no answer or between  15:00h - 7:00h: call [phone]  11/14/2014 10:41 AM

## 2014-11-15 ENCOUNTER — Telehealth: Payer: Self-pay

## 2014-11-15 ENCOUNTER — Ambulatory Visit
Admission: RE | Admit: 2014-11-15 | Discharge: 2014-11-15 | Disposition: A | Discharge status: Home / Self Care | Payer: Medicare (Managed Care) | Source: Ambulatory Visit | Attending: Radiation Oncology | Admitting: Radiation Oncology

## 2014-11-15 ENCOUNTER — Telehealth: Payer: Self-pay | Admitting: *Deleted

## 2014-11-15 ENCOUNTER — Telehealth: Payer: Self-pay | Admitting: Hematology

## 2014-11-15 DIAGNOSIS — C801 Malignant (primary) neoplasm, unspecified: Secondary | ICD-10-CM | POA: Diagnosis present

## 2014-11-15 DIAGNOSIS — Z510 Encounter for antineoplastic radiation therapy: Secondary | ICD-10-CM | POA: Diagnosis not present

## 2014-11-15 DIAGNOSIS — C7800 Secondary malignant neoplasm of unspecified lung: Secondary | ICD-10-CM | POA: Diagnosis present

## 2014-11-15 NOTE — Telephone Encounter (Signed)
Spoke with Tammi and reviewed all appointment times in computer as of today 11-15-14.  Pt to arrive 15-30 minutes prior to appointment.   Included appointment with Dr. Burr Medico on 11-24-14 at 1100. Pt currently a resident at [facility].

## 2014-11-15 NOTE — Telephone Encounter (Signed)
Hosp f/u appt-s/w Tammy at [facility] and gave appt for 10/13 @ 11 w/Dr. Burr Medico.

## 2014-11-15 NOTE — Telephone Encounter (Signed)
Called [facility] [phone]  Spoke with Marzetta Board who transferred me to [place], transportation scheduler, she will have patient here today 345pm for 4pm treatment, will let them know if tomorrow appt time 630pm can be sooner 1:13 PM

## 2014-11-15 NOTE — Telephone Encounter (Signed)
Called [facility],patient transported there yesterday, spoke with Marzetta Board, she isn't sure if transportation can bring patient due to [employer], if not covered will be 57.00 per day, she has to wait until [employer] transportation person comes in today ,doubts late transportation, though, no family members can bring patient,  She will call us later today asked if she had a calendar with patient's schedule I could fax to them,."yes, , we have his schedule,' thanked [place] waiting call back from her, called [place], spoke with [facility] RT therapist of patient status 8:30 AM

## 2014-11-16 ENCOUNTER — Encounter: Payer: Self-pay | Admitting: Radiation Oncology

## 2014-11-16 ENCOUNTER — Ambulatory Visit
Admission: RE | Admit: 2014-11-16 | Discharge: 2014-11-16 | Disposition: A | Discharge status: Home / Self Care | Payer: Medicare (Managed Care) | Source: Ambulatory Visit | Attending: Radiation Oncology | Admitting: Radiation Oncology

## 2014-11-16 VITALS — BP 149/62 | HR 80 | Temp 98.6°F | Resp 20

## 2014-11-16 DIAGNOSIS — C3490 Malignant neoplasm of unspecified part of unspecified bronchus or lung: Secondary | ICD-10-CM

## 2014-11-16 DIAGNOSIS — C801 Malignant (primary) neoplasm, unspecified: Secondary | ICD-10-CM

## 2014-11-16 DIAGNOSIS — Z510 Encounter for antineoplastic radiation therapy: Secondary | ICD-10-CM | POA: Diagnosis not present

## 2014-11-16 DIAGNOSIS — C7931 Secondary malignant neoplasm of brain: Secondary | ICD-10-CM

## 2014-11-16 MED ORDER — OXYCODONE-ACETAMINOPHEN 5-325 MG PO TABS: 1.0000 | ORAL_TABLET | ORAL | Status: AC | PRN

## 2014-11-16 MED ORDER — DEXAMETHASONE 2 MG PO TABS: ORAL_TABLET | ORAL | Status: AC

## 2014-11-16 MED ORDER — HYDROMORPHONE HCL 4 MG/ML IJ SOLN
1.0000 mg | Freq: Once | INTRAMUSCULAR | Status: AC
  Administered 2014-11-16: 1 mg via INTRAMUSCULAR
  Filled 2014-11-16: qty 1

## 2014-11-16 NOTE — Progress Notes (Signed)
  Radiation Oncology         [phone] ________________________________  Name: Lui Bellis Shrout MRN: [id number]  Date: 11/09/2014  DOB: [birth year]  SIMULATION AND TREATMENT PLANNING NOTE  DIAGNOSIS:  Metastatic lung cancer  Site:  Whole brain radiation treatment and mediastinum  NARRATIVE:  The patient was brought to the [facility] suite.  Identity was confirmed.  All relevant records and images related to the planned course of therapy were reviewed.   Written consent to proceed with treatment was confirmed which was freely given after reviewing the details related to the planned course of therapy had been reviewed with the patient.  Then, the patient was set-up in a stable reproducible  supine position for radiation therapy.  CT images were obtained.  Surface markings were placed.    Medically necessary complex treatment device(s) for immobilization:  Customized thermoplastic head cast.   The CT images were loaded into the planning software.  Then the target and avoidance structures were contoured.  Treatment planning then occurred.  The radiation prescription was entered and confirmed.  A total of 6 complex treatment devices were fabricated which relate to the designed radiation treatment fields. Each of these customized fields/ complex treatment devices will be used on a daily basis during the radiation course. I have requested : 3D Simulation  I have requested a DVH of the following structures: Target volume, spinal cord, lungs.   The patient will undergo daily image guidance to ensure accurate localization of the target, and adequate minimize dose to the normal surrounding structures in close proximity to the target.   PLAN:  The patient will receive 30 Gy in 10 fractions To both of these separate target regions..  ________________________________   Jodelle Gross, MD, PhD

## 2014-11-16 NOTE — Progress Notes (Signed)
   Department of Radiation Oncology  Phone:  [phone] Fax:        [phone]  Weekly Treatment Note    Name: Casey Charles Date: 11/16/2014 MRN: [id number] DOB: 12-16-1945   Current dose: 6 Gy  Current fraction: 2   MEDICATIONS: Current Outpatient Prescriptions  Medication Sig Dispense Refill  . acetaminophen (PAIN RELIEF) 325 MG tablet Take 650 mg by mouth every 6 (six) hours as needed for moderate pain.    Marland Kitchen albuterol (PROVENTIL) (2.5 MG/3ML) 0.083% nebulizer solution Take 3 mLs (2.5 mg total) by nebulization every 2 (two) hours as needed for wheezing or shortness of breath. 75 mL 12  . amLODipine (NORVASC) 10 MG tablet Take 1 tablet (10 mg total) by mouth daily. 30 tablet 0  . dexamethasone (DECADRON) 2 MG tablet Take 4 mg PO TID. 40 tablet 0  . isosorbide mononitrate (IMDUR) 30 MG 24 hr tablet Take 0.5 tablets (15 mg total) by mouth daily. 30 tablet 0  . Multiple Vitamin (MULTIVITAMIN WITH MINERALS) TABS tablet Take 1 tablet by mouth daily.    Marland Kitchen oxyCODONE-acetaminophen (PERCOCET/ROXICET) 5-325 MG tablet Take 1 tablet by mouth every 4 (four) hours as needed for severe pain. 40 tablet 0   No current facility-administered medications for this encounter.     ALLERGIES: Review of patient's allergies indicates no known allergies.   LABORATORY DATA:  Lab Results  Component Value Date   WBC 6.4 11/14/2014   HGB 15.0 11/14/2014   HCT 43.1 11/14/2014   MCV 89.8 11/14/2014   PLT 191 11/14/2014   Lab Results  Component Value Date   NA 134* 11/14/2014   K 4.2 11/14/2014   CL 102 11/14/2014   CO2 22 11/14/2014   No results found for: ALT, AST, GGT, ALKPHOS, BILITOT   NARRATIVE: Casey Charles was seen today for weekly treatment management. The chart was checked and the patient's films were reviewed.  Weekly rad tx 2st Whole brain and chest,patient here before tx from [place] care facility, c/o pain 10/10 scale, numbness and painleft side head all way to  his toes, cannot pick up anything with left hand. In w/c, not weighed, eating okay, right hand grip strong, unable to squeeze left hand grip, very unsteady, fall risk yellow band on right wrist.   Using Tylenol at health care facility but with no alleviation of pain.  PHYSICAL EXAMINATION: oral temperature is 98.6 F (37 C). His blood pressure is 149/62 and his pulse is 80. His respiration is 20.       ASSESSMENT: The patient is doing satisfactorily with treatment.  PLAN: We will continue with the patient's radiation treatment as planned. Patient given 1 mg of Dilaudid in clinic today. I will prescribe Percocet to be taken as needed at his facility. In addition, I have prescribed an increase of patient's Decadron to 3x times a day, 4 mg tablets.  ------------------------------------------------  Casey Gross, MD, PhD  This document serves as a record of services personally performed by Casey Rudd, MD. It was created on his behalf by Casey Charles, a trained medical scribe. The creation of this record is based on the scribe's personal observations and the provider's statements to them. This document has been checked and approved by the attending provider.

## 2014-11-16 NOTE — Progress Notes (Signed)
Per MD written and verbal  gave 1 mg IM dilaudid in right deltoid for pain 10/10, Elmo Putt RN verified medication dilaudid '1mg'$  for pain 10/10 3:59 PM

## 2014-11-16 NOTE — Progress Notes (Signed)
Weekly rad tx 2st Whole brain and chest,patient here before tx from [place] care facility, c/o pain 10/10 scale, numbness and pain  left side head all way to his toes, cannot pick up anything with left hand, pain says   in w/c, not weighed, eating okay, right hand grip strong, unable to squeeze left hand grip, very unsteady, fall risk yellow band on right wrist.k BP 149/62 mmHg  Pulse 80  Temp(Src) 98.6 F (37 C) (Oral)  Resp 20  Wt Readings from Last 3 Encounters:  11/13/14 123 lb 6.4 oz (55.974 kg)   Wt Readings from Last 3 Encounters:  11/13/14 123 lb 6.4 oz (55.974 kg)

## 2014-11-17 ENCOUNTER — Ambulatory Visit
Admission: RE | Admit: 2014-11-17 | Discharge: 2014-11-17 | Disposition: A | Discharge status: Home / Self Care | Payer: Medicare (Managed Care) | Source: Ambulatory Visit | Attending: Radiation Oncology | Admitting: Radiation Oncology

## 2014-11-17 ENCOUNTER — Telehealth: Payer: Self-pay | Admitting: *Deleted

## 2014-11-17 DIAGNOSIS — Z510 Encounter for antineoplastic radiation therapy: Secondary | ICD-10-CM | POA: Diagnosis not present

## 2014-11-17 NOTE — Progress Notes (Signed)
Department of Radiation Oncology  Phone:  [phone] Fax:        [phone]  Weekly Treatment Note    Name: Casey Charles Date: 11/18/2014 MRN: [id number] DOB: 1945-06-06   Current dose: 15 Gy  Current fraction: 5   MEDICATIONS: Current Outpatient Prescriptions  Medication Sig Dispense Refill  . acetaminophen (PAIN RELIEF) 325 MG tablet Take 650 mg by mouth every 6 (six) hours as needed for moderate pain.    Marland Kitchen albuterol (PROVENTIL) (2.5 MG/3ML) 0.083% nebulizer solution Take 3 mLs (2.5 mg total) by nebulization every 2 (two) hours as needed for wheezing or shortness of breath. 75 mL 12  . amLODipine (NORVASC) 10 MG tablet Take 1 tablet (10 mg total) by mouth daily. 30 tablet 0  . dexamethasone (DECADRON) 2 MG tablet Take 4 mg PO TID. 40 tablet 0  . isosorbide mononitrate (IMDUR) 30 MG 24 hr tablet Take 0.5 tablets (15 mg total) by mouth daily. 30 tablet 0  . Multiple Vitamin (MULTIVITAMIN WITH MINERALS) TABS tablet Take 1 tablet by mouth daily.    Marland Kitchen oxyCODONE-acetaminophen (PERCOCET/ROXICET) 5-325 MG tablet Take 1 tablet by mouth every 4 (four) hours as needed for severe pain. 40 tablet 0   No current facility-administered medications for this encounter.     ALLERGIES: Review of patient's allergies indicates no known allergies.   LABORATORY DATA:  Lab Results  Component Value Date   WBC 6.4 11/14/2014   HGB 15.0 11/14/2014   HCT 43.1 11/14/2014   MCV 89.8 11/14/2014   PLT 191 11/14/2014   Lab Results  Component Value Date   NA 134* 11/14/2014   K 4.2 11/14/2014   CL 102 11/14/2014   CO2 22 11/14/2014   No results found for: ALT, AST, GGT, ALKPHOS, BILITOT   NARRATIVE: Casey Charles was seen today for weekly treatment management. The patient reports symptoms of skin irritation, head aches, dehydration, fatigue, pain, difficult swallowing, and throat changes. He has increase protein in diet, stay hyrated drink more fluids. He is wearing a yellow fall risk  bracelet on right wrist and has a foley catheter bsd. No changes in appetite are reported. He is very weak and fatigued. He presents with chapped lips and dry skin. He stated that his left hand was very weak, but his grip may have improved. He states that he tries to "do the exercises on the TV". Although he is unsure of the time, an enema was administered. He denies symptoms of heart burn. He is currently staying at the [facility]. To call transportation: [employer] at [phone]. The chart was checked and the patient's films were reviewed. The patient projected a healthy mental status and was not accompanied by family. His ambulatory status was wheel-chair bound.   PHYSICAL EXAMINATION: weight is 137 lb 4.8 oz (62.279 kg). His oral temperature is 98.1 F (36.7 C). His blood pressure is 146/65 and his pulse is 67. His respiration is 18.  The patient is alert. Infection was not observed in the mouth upon physical examination. Movement of his left hand has improved since last week.  ASSESSMENT: Casey Charles is a 69 year old male presenting to clinic in regards to his metastatic lung cancer The patient is doing satisfactorily with treatment. He is managing reported symptoms appropriately. The patient understands the importance of brushing both is tongue and teeth to prevent infection. He understands the importance of administering medication as instructed. The patient understands that he can access his appointments and  medical records via [place].    PLAN: The patient is advised to continue treatment as planned. The patient has been instructed to brush both his tongue and teeth to prevent infection. He is to administer medication as previously instructed. The patient is aware of his follow-up appointment with radiation oncology to take place as scheduled. All vocalized questions and concerns have been addressed. If the patient develops any further questions or concerns in regards to his  treatment and recovery, he has been encouraged to contact Dr. Lisbeth Renshaw, MD.   This document serves as a record of services personally performed by Kyung Rudd, MD. It was created on his behalf by Lenn Cal, a trained medical scribe. The creation of this record is based on the scribe's personal observations and the provider's statements to them. This document has been checked and approved by the attending provider.  ------------------------------------------------  Jodelle Gross, MD, PhD

## 2014-11-17 NOTE — Telephone Encounter (Signed)
Called [facility],spoke with Tammy, Patine has been waiting 45-50 minutes after rad txs, asked her to call transportation again, she stated she would call [employer] at [phone], thanked her and escorted patient via w/c up to loby ran into transportation male and wil have patient upstairs from now on  [address] xs, infomed Melissa RT therapist of after rad tx to have someone take patient to lobby to wait for his transportation, different drivers  4:44 PM

## 2014-11-18 ENCOUNTER — Ambulatory Visit
Admission: RE | Admit: 2014-11-18 | Discharge: 2014-11-18 | Disposition: A | Discharge status: Home / Self Care | Payer: Medicare (Managed Care) | Source: Ambulatory Visit | Attending: Radiation Oncology | Admitting: Radiation Oncology

## 2014-11-18 ENCOUNTER — Encounter: Payer: Self-pay | Admitting: Radiation Oncology

## 2014-11-18 VITALS — BP 146/65 | HR 67 | Temp 98.1°F | Resp 18 | Wt 137.3 lb

## 2014-11-18 DIAGNOSIS — Z510 Encounter for antineoplastic radiation therapy: Secondary | ICD-10-CM | POA: Diagnosis not present

## 2014-11-18 DIAGNOSIS — C801 Malignant (primary) neoplasm, unspecified: Principal | ICD-10-CM

## 2014-11-18 DIAGNOSIS — C7931 Secondary malignant neoplasm of brain: Secondary | ICD-10-CM

## 2014-11-18 NOTE — Progress Notes (Addendum)
Weekly rad tx whole brain and chest, pt education done, need to reinforce  Ways to manage side effects, skin irritation,  Head aches, dehydration,fatigue,pain, difficult swallowing, throat changes, increase protein in diet, stay hyrated drink more fluids, patient in w/c, yellow fall risk bracelet on right wrist, has foley catheter bsd,  Very weak and fatigued, staying at [facility] care facility, to call transportation [employer] at [phone] when finished, appetite fair , still has numbness left shoulder and left arm to hand, still , left thigh leg clearing up some stated. Diet gingerale offered, physical therapy at health car facility ,feeling some better more alert. Taking  '4mg'$  decadron tid , tongue white coated,  3:05 PM  3:04 PM BP 146/65 mmHg  Pulse 67  Temp(Src) 98.1 F (36.7 C) (Oral)  Resp 18  Wt 137 lb 4.8 oz (62.279 kg)  Wt Readings from Last 3 Encounters:  11/18/14 133 lb 11.2 oz (60.646 kg)  11/13/14 123 lb 6.4 oz (55.974 kg)

## 2014-11-21 ENCOUNTER — Ambulatory Visit
Admission: RE | Admit: 2014-11-21 | Discharge: 2014-11-21 | Disposition: A | Discharge status: Home / Self Care | Payer: Medicare (Managed Care) | Source: Ambulatory Visit | Attending: Radiation Oncology | Admitting: Radiation Oncology

## 2014-11-21 DIAGNOSIS — Z510 Encounter for antineoplastic radiation therapy: Secondary | ICD-10-CM | POA: Diagnosis not present

## 2014-11-22 ENCOUNTER — Inpatient Hospital Stay: Payer: Medicare (Managed Care) | Admitting: Hematology

## 2014-11-22 ENCOUNTER — Ambulatory Visit
Admission: RE | Admit: 2014-11-22 | Discharge: 2014-11-22 | Disposition: A | Discharge status: Home / Self Care | Payer: Medicare (Managed Care) | Source: Ambulatory Visit | Attending: Radiation Oncology | Admitting: Radiation Oncology

## 2014-11-22 DIAGNOSIS — Z510 Encounter for antineoplastic radiation therapy: Secondary | ICD-10-CM | POA: Diagnosis not present

## 2014-11-23 ENCOUNTER — Ambulatory Visit
Admit: 2014-11-23 | Discharge: 2014-11-23 | Disposition: A | Discharge status: Home / Self Care | Payer: Medicare (Managed Care) | Attending: Radiation Oncology | Admitting: Radiation Oncology

## 2014-11-23 DIAGNOSIS — Z510 Encounter for antineoplastic radiation therapy: Secondary | ICD-10-CM | POA: Diagnosis not present

## 2014-11-24 ENCOUNTER — Ambulatory Visit
Admit: 2014-11-24 | Discharge: 2014-11-24 | Disposition: A | Discharge status: Home / Self Care | Payer: Medicare (Managed Care) | Attending: Radiation Oncology | Admitting: Radiation Oncology

## 2014-11-24 ENCOUNTER — Ambulatory Visit (HOSPITAL_BASED_OUTPATIENT_CLINIC_OR_DEPARTMENT_OTHER): Payer: Medicare (Managed Care)

## 2014-11-24 ENCOUNTER — Ambulatory Visit (HOSPITAL_BASED_OUTPATIENT_CLINIC_OR_DEPARTMENT_OTHER): Payer: Medicare (Managed Care) | Admitting: Hematology

## 2014-11-24 ENCOUNTER — Telehealth: Payer: Self-pay | Admitting: Hematology

## 2014-11-24 VITALS — BP 146/59 | HR 81 | Temp 97.7°F | Resp 20 | Ht 73.0 in

## 2014-11-24 DIAGNOSIS — C7931 Secondary malignant neoplasm of brain: Secondary | ICD-10-CM | POA: Diagnosis not present

## 2014-11-24 DIAGNOSIS — R531 Weakness: Secondary | ICD-10-CM | POA: Diagnosis not present

## 2014-11-24 DIAGNOSIS — C342 Malignant neoplasm of middle lobe, bronchus or lung: Secondary | ICD-10-CM | POA: Diagnosis not present

## 2014-11-24 DIAGNOSIS — Z510 Encounter for antineoplastic radiation therapy: Secondary | ICD-10-CM | POA: Diagnosis not present

## 2014-11-24 DIAGNOSIS — C3411 Malignant neoplasm of upper lobe, right bronchus or lung: Secondary | ICD-10-CM

## 2014-11-24 DIAGNOSIS — C3492 Malignant neoplasm of unspecified part of left bronchus or lung: Secondary | ICD-10-CM

## 2014-11-24 DIAGNOSIS — C3491 Malignant neoplasm of unspecified part of right bronchus or lung: Secondary | ICD-10-CM

## 2014-11-24 NOTE — Telephone Encounter (Signed)
per pof to sch pt appt-sent pt back to lab-gave pt copy of avs °

## 2014-11-25 ENCOUNTER — Ambulatory Visit
Admit: 2014-11-25 | Discharge: 2014-11-25 | Disposition: A | Discharge status: Home / Self Care | Payer: Medicare (Managed Care) | Attending: Radiation Oncology | Admitting: Radiation Oncology

## 2014-11-25 ENCOUNTER — Ambulatory Visit
Admission: RE | Admit: 2014-11-25 | Discharge: 2014-11-25 | Disposition: A | Discharge status: Home / Self Care | Payer: Medicare (Managed Care) | Source: Ambulatory Visit | Attending: Radiation Oncology | Admitting: Radiation Oncology

## 2014-11-25 ENCOUNTER — Telehealth: Payer: Self-pay

## 2014-11-25 VITALS — BP 137/56 | HR 66 | Temp 97.6°F

## 2014-11-25 DIAGNOSIS — C7931 Secondary malignant neoplasm of brain: Secondary | ICD-10-CM

## 2014-11-25 DIAGNOSIS — Z510 Encounter for antineoplastic radiation therapy: Secondary | ICD-10-CM | POA: Diagnosis not present

## 2014-11-25 DIAGNOSIS — C801 Malignant (primary) neoplasm, unspecified: Principal | ICD-10-CM

## 2014-11-25 NOTE — Telephone Encounter (Signed)
Spoke with [employer] DON at [facility] on [address]. [phone].verified that patient does have percocet for pain and informed of dexamethasone taper per Dr.Moody. Taper instructions are on AVS, copy sent with patient as well as follow up appointment.My name and number on paperwork in case there are any questions.

## 2014-11-25 NOTE — Progress Notes (Signed)
  Department of Radiation Oncology  Phone:  [phone] Fax:        [phone]  Weekly Treatment Note    Name: Casey Charles Date: 11/25/2014 MRN: [id number] DOB: 06-Aug-1945   Current dose: 30 Gy  Current fraction:10   MEDICATIONS: Current Outpatient Prescriptions  Medication Sig Dispense Refill  . acetaminophen (PAIN RELIEF) 325 MG tablet Take 650 mg by mouth every 6 (six) hours as needed for moderate pain.    Marland Kitchen albuterol (PROVENTIL) (2.5 MG/3ML) 0.083% nebulizer solution Take 3 mLs (2.5 mg total) by nebulization every 2 (two) hours as needed for wheezing or shortness of breath. 75 mL 12  . amLODipine (NORVASC) 10 MG tablet Take 1 tablet (10 mg total) by mouth daily. 30 tablet 0  . dexamethasone (DECADRON) 2 MG tablet Take 4 mg PO TID. 40 tablet 0  . isosorbide mononitrate (IMDUR) 30 MG 24 hr tablet Take 0.5 tablets (15 mg total) by mouth daily. 30 tablet 0  . Multiple Vitamin (MULTIVITAMIN WITH MINERALS) TABS tablet Take 1 tablet by mouth daily.    Marland Kitchen oxyCODONE-acetaminophen (PERCOCET/ROXICET) 5-325 MG tablet Take 1 tablet by mouth every 4 (four) hours as needed for severe pain. 40 tablet 0   No current facility-administered medications for this encounter.     ALLERGIES: Review of patient's allergies indicates no known allergies.   LABORATORY DATA:  Lab Results  Component Value Date   WBC 6.4 11/14/2014   HGB 15.0 11/14/2014   HCT 43.1 11/14/2014   MCV 89.8 11/14/2014   PLT 191 11/14/2014   Lab Results  Component Value Date   NA 134* 11/14/2014   K 4.2 11/14/2014   CL 102 11/14/2014   CO2 22 11/14/2014   No results found for: ALT, AST, GGT, ALKPHOS, BILITOT   NARRATIVE: Mervil Wacker Llerenas was seen today for weekly treatment management. The chart was checked and the patient's films were reviewed.   BP 137/56 mmHg  Pulse 66  Temp(Src) 97.6 F (36.4 C)  SpO2 99%  Patient has completed  10 of 10 treatments to whole brain and chest.Currently taking  dexamethasone  4 mg tid.denies headache or nausea.Has left sided pain from upper extremity down lower extremity.One month follow up scheduled.I will call [facility] to speak with caregiver regarding medications.  PHYSICAL EXAMINATION: temperature is 97.6 F (36.4 C). His blood pressure is 137/56 and his pulse is 66. His oxygen saturation is 99%.        ASSESSMENT: The patient did satisfactorily with treatment.  PLAN: The patient will follow-up in our clinic in 1 month. Instructions for tapering his Decadron will be called in to his current facility.

## 2014-11-25 NOTE — Progress Notes (Signed)
BP 137/56 mmHg  Pulse 66  Temp(Src) 97.6 F (36.4 C)  SpO2 99%  Patient has completed  10 of 10 treatments to whole brain and chest.Currently taking dexamethasone  4 mg tid.denies headache or nausea.Has left sided pain from upper extremity down lower extremity.One month follow up scheduled.I will call [facility] to speak with caregiver regarding medications.

## 2014-11-25 NOTE — Patient Instructions (Signed)
Take '4mg'$  decadron x3 per day for 5 days (starting 10/14). Then take decadron 4 mg x2 per day x 5 days. Then take '4mg'$  decadron x1 per day for 5 days. Then take 1/2 tablet decadron x 5 days. Then stop medication.

## 2014-11-26 ENCOUNTER — Encounter: Payer: Self-pay | Admitting: Hematology

## 2014-11-26 NOTE — Progress Notes (Signed)
[place]  Telephone:([phone] [phone]  Clinic Follow up Note   Patient Care Team: Kyung Rudd, MD as Consulting Physician (Radiation Oncology) Truitt Merle, MD as Consulting Physician (Hematology) 11/24/2014  SUMMARY OF ONCOLOGIC HISTORY: Oncology History   Non-small cell cancer of right lung [facility])   Staging form: Lung, AJCC 6th Edition     Clinical stage from 11/26/2014: Stage IV (T1, N3, M1) - Signed by Truitt Merle, MD on 11/26/2014       Non-small cell cancer of right lung ([place])   11/07/2014 Imaging CT head without contrast showed numerous brain metastases with hemorrhage, associated variable vasogenic edema with mild local mass effect.   11/07/2014 Imaging CT chest, abdomen and pelvis with contrast showed a 2.2 cm right middle lobe lung nodule, extensive mediastinal and bilateral hilar lymphadenopathy, bilateral adrenal masses.   11/11/2014 Initial Diagnosis Non-small cell cancer of right lung ([place])   11/11/2014 Initial Biopsy Subcarinal lymph node biopsy showed abundant necrosis and Aredia malignant cells consistent with non-small cell carcinoma.    INTERVAL HISTORY: Mr. Casey Charles is a 69 year old African-American male, with heavy smoking history, here for follow-up after his hospital discharge. I initially saw him in [facility].  He presented with sudden onset left upper and lower extremity weakness and numbness. He was admitted to the hospital on 11/07/2014. CT had showed numerous hemorrhagic masses consistent with metastatic disease. CT chest abdomen and pelvis showed right middle lobe nodule, extensive mediastinal adenopathy, and bilateral to adrenal gland metastasis. He underwent bronchoscopy and EBUS, and mediastinal lymph node biopsy showed extensive necrosis, poorly differentiated carcinoma, likely non-small cell. He started whole brain radiation in the hospital, and was discharged to a nursing home.  He has been tolerating brain radiation  well, he will finish tomorrow. He is doing some rehabilitation at the facility, do not able to walk very far, his left-sided weakness has improved some, but his gait is on study. He is in wheelchair most of time. He came to my clinic by himself today. His appetite and eating are decent, he denies significant pain, mild shortness breath on exertion, no other discomfort.  REVIEW OF SYSTEMS:   Constitutional: Denies fevers, chills or abnormal weight loss Eyes: Denies blurriness of vision Ears, nose, mouth, throat, and face: Denies mucositis or sore throat Respiratory: Denies cough, dyspnea or wheezes Cardiovascular: Denies palpitation, chest discomfort or lower extremity swelling Gastrointestinal:  Denies nausea, heartburn or change in bowel habits Skin: Denies abnormal skin rashes Lymphatics: Denies new lymphadenopathy or easy bruising Neurological:Denies numbness, tingling, (+) left side weakness  Behavioral/Psych: Mood is stable, no new changes  All other systems were reviewed with the patient and are negative.  MEDICAL HISTORY:  Past Medical History  Diagnosis Date  . Chronic back pain     SURGICAL HISTORY: Past Surgical History  Procedure Laterality Date  . Endobronchial ultrasound Bilateral 11/11/2014    Procedure: ENDOBRONCHIAL ULTRASOUND;  Surgeon: Juanito Doom, MD;  Location: [facility];  Service: Cardiopulmonary;  Laterality: Bilateral;    I have reviewed the social history and family history with the patient and they are unchanged from previous note.  ALLERGIES:  has No Known Allergies.  MEDICATIONS:  Current Outpatient Prescriptions  Medication Sig Dispense Refill  . acetaminophen (PAIN RELIEF) 325 MG tablet Take 650 mg by mouth every 6 (six) hours as needed for moderate pain.    Marland Kitchen albuterol (PROVENTIL) (2.5 MG/3ML) 0.083% nebulizer solution Take 3 mLs (2.5 mg total) by nebulization every 2 (  two) hours as needed for wheezing or shortness of breath. 75 mL 12  .  amLODipine (NORVASC) 10 MG tablet Take 1 tablet (10 mg total) by mouth daily. 30 tablet 0  . dexamethasone (DECADRON) 2 MG tablet Take 4 mg PO TID. 40 tablet 0  . isosorbide mononitrate (IMDUR) 30 MG 24 hr tablet Take 0.5 tablets (15 mg total) by mouth daily. 30 tablet 0  . Multiple Vitamin (MULTIVITAMIN WITH MINERALS) TABS tablet Take 1 tablet by mouth daily.    Marland Kitchen oxyCODONE-acetaminophen (PERCOCET/ROXICET) 5-325 MG tablet Take 1 tablet by mouth every 4 (four) hours as needed for severe pain. 40 tablet 0   No current facility-administered medications for this visit.    PHYSICAL EXAMINATION: ECOG PERFORMANCE STATUS: 3 - Symptomatic, >50% confined to bed  Filed Vitals:   11/24/14 1018  BP: 146/59  Pulse: 81  Temp: 97.7 F (36.5 C)  Resp: 20   There were no vitals filed for this visit.  GENERAL:alert, no distress and comfortable SKIN: skin color, texture, turgor are normal, no rashes or significant lesions EYES: normal, Conjunctiva are pink and non-injected, sclera clear OROPHARYNX:no exudate, no erythema and lips, buccal mucosa, and tongue normal  NECK: supple, thyroid normal size, non-tender, without nodularity LYMPH:  no palpable lymphadenopathy in the cervical, axillary or inguinal LUNGS: clear to auscultation and percussion with normal breathing effort HEART: regular rate & rhythm and no murmurs and no lower extremity edema ABDOMEN:abdomen soft, non-tender and normal bowel sounds Musculoskeletal:no cyanosis of digits and no clubbing  NEURO: alert & oriented x 3 with fluent speech, no focal motor/sensory deficits  LABORATORY DATA:  I have reviewed the data as listed CBC Latest Ref Rng 11/14/2014 11/13/2014 11/12/2014  WBC 4.0 - 10.5 K/uL 6.4 6.6 5.5  Hemoglobin 13.0 - 17.0 g/dL 15.0 16.1 16.1  Hematocrit 39.0 - 52.0 % 43.1 45.5 46.6  Platelets 150 - 400 K/uL 191 214 199     CMP Latest Ref Rng 11/14/2014 11/13/2014 11/12/2014  Glucose 65 - 99 mg/dL 140(H) 128(H) 133(H)  BUN 6  - 20 mg/dL 27(H) 24(H) 23(H)  Creatinine 0.61 - 1.24 mg/dL 0.77 0.72 0.79  Sodium 135 - 145 mmol/L 134(L) 133(L) 135  Potassium 3.5 - 5.1 mmol/L 4.2 4.4 4.2  Chloride 101 - 111 mmol/L 102 102 101  CO2 22 - 32 mmol/L '22 22 23  '$ Calcium 8.9 - 10.3 mg/dL 9.3 9.2 9.3   PATHOLOGY REPORT Adequacy Reason Satisfactory For Evaluation. Diagnosis FINE NEEDLE ASPIRATION, ENDOSCOPIC, EBUS, # 7 NODE, SUBCARINAL,(SPECIMEN 1 OF 1 COLLECTED 11/11/14): ABUNDANT NECROSIS AND RARE MALIGNANT CELLS CONSISENT WITH NON SMALL CELL CARCINOMA. DR. Tresa Moore AGREES. [place] ON 11/14/14. 11/11/2014 Preliminary Diagnosis Intraoperative Diagnosis: Malignant cells present. Extensive necrosis. [facility])   RADIOGRAPHIC STUDIES: I have personally reviewed the radiological images as listed and agreed with the findings in the report.  CT chest, abdomen and pelvis with contrast 11/07/2014 IMPRESSION: 1. Intrathoracic malignancy, likely bronchogenic carcinoma. There is a right middle lobe pulmonary nodule with right hilar and bilateral mediastinal lymphadenopathy. Bilateral adrenal masses, presumed metastases. 2. Interstitial lung disease. Granulomatous right upper lobe scarring, likely postinfectious.   CT head without contrast 11/07/2058 IMPRESSION: Numerous brain masses most consistent with hemorrhagic metastases. Associated variable vasogenic edema with mild local mass effect.   ASSESSMENT & PLAN:  69 year old African-American male, with have a smoking history, presented with sudden onset of left-sided weakness.  1. Right middle lobe non-small cell lung cancer, [id number], with diffuse brain metastasis, and bilateral  adrenal masses. -I reviewed his CT scan findings, and biopsy results in great details. -We reviewed the incurable nature of his metastatic lung cancer, and overall very poor prognosis. -He is finishing whole brain radiation tomorrow, so far tolerating well. -Due to his poor performance  status, mainly due to his left-sided weakness, and very limited social support, I do not think he is a candidate for systemic chemotherapy, unless his performance status significantly improves after rehabilitation. -He is single, has very limited social support. I previous spoke with his mother, she is elderly and has multiple medical issues, not able to help much. He states his sister may able to help him, I did attempt to call his sister was not able to leave a message. -His initial biopsy sample was very limited, not able to do any further medical testing. -I recommend Guardant 360, which is a blood genomic attest, to see if he has any targetable gene mutations. He agreed with the test. -He is agreeable with DO NOT RESUSCITATE -I'll see him back in 3 weeks to review the Guardant 360 test result. I strongly recommend him to bring his sister with him next time.   2. Smoking cessation -He has quit smoking since his lung cancer diagnosis.  3. Left-sided weakness -I recommend him to continue rehabilitation  Orders Placed This Encounter  Procedures  . GUARDANT 360    Standing Status: Future     Number of Occurrences: 1     Standing Expiration Date: 11/24/2015   All questions were answered. The patient knows to call the clinic with any problems, questions or concerns. No barriers to learning was detected.  I spent 35 minutes counseling the patient face to face. The total time spent in the appointment was 45 minutes and more than 50% was on counseling and review of test results     Truitt Merle, MD 11/26/2014 11:11 AM

## 2014-12-14 LAB — GUARDANT 360

## 2014-12-16 ENCOUNTER — Ambulatory Visit (HOSPITAL_BASED_OUTPATIENT_CLINIC_OR_DEPARTMENT_OTHER): Payer: Medicare (Managed Care) | Admitting: Hematology

## 2014-12-16 ENCOUNTER — Telehealth: Payer: Self-pay | Admitting: Hematology

## 2014-12-16 VITALS — BP 149/60 | HR 104 | Temp 98.4°F | Resp 18 | Ht 73.0 in | Wt 131.9 lb

## 2014-12-16 DIAGNOSIS — C3491 Malignant neoplasm of unspecified part of right bronchus or lung: Secondary | ICD-10-CM

## 2014-12-16 DIAGNOSIS — C7931 Secondary malignant neoplasm of brain: Secondary | ICD-10-CM | POA: Diagnosis not present

## 2014-12-16 DIAGNOSIS — C7989 Secondary malignant neoplasm of other specified sites: Secondary | ICD-10-CM | POA: Diagnosis not present

## 2014-12-16 DIAGNOSIS — M6289 Other specified disorders of muscle: Secondary | ICD-10-CM

## 2014-12-16 DIAGNOSIS — C342 Malignant neoplasm of middle lobe, bronchus or lung: Secondary | ICD-10-CM | POA: Diagnosis not present

## 2014-12-16 DIAGNOSIS — E279 Disorder of adrenal gland, unspecified: Secondary | ICD-10-CM

## 2014-12-16 DIAGNOSIS — M6281 Muscle weakness (generalized): Secondary | ICD-10-CM

## 2014-12-16 NOTE — Telephone Encounter (Signed)
Gave patient/relative avs report and appointments for November and appointment with Dr. Lisbeth Renshaw for December.

## 2014-12-16 NOTE — Progress Notes (Signed)
[place]  Telephone:(336) [phone] Fax:(336) [phone]  Clinic Follow up Note   Patient Care Team: Kyung Rudd, MD as Consulting Physician (Radiation Oncology) Truitt Merle, MD as Consulting Physician (Hematology) 12/16/2014  SUMMARY OF ONCOLOGIC HISTORY: Oncology History   Non-small cell cancer of right lung [facility])   Staging form: Lung, AJCC 6th Edition     Clinical stage from 11/26/2014: Stage IV (T1, N3, M1) - Signed by Truitt Merle, MD on 11/26/2014       Non-small cell cancer of right lung ([place])   11/07/2014 Imaging CT head without contrast showed numerous brain metastases with hemorrhage, associated variable vasogenic edema with mild local mass effect.   11/07/2014 Imaging CT chest, abdomen and pelvis with contrast showed a 2.2 cm right middle lobe lung nodule, extensive mediastinal and bilateral hilar lymphadenopathy, bilateral adrenal masses.   11/10/2014 - 11/25/2014 Radiation Therapy 30Gy in 10 treatments to whole brain and chest.   11/11/2014 Initial Diagnosis Non-small cell cancer of right lung ([place])   11/11/2014 Initial Biopsy Subcarinal lymph node biopsy showed abundant necrosis and Aredia malignant cells consistent with non-small cell carcinoma.   11/24/2014 Miscellaneous Guardant360 Genomic test showed positive Tp53, PTEN, NOTCH1, FGFR2 mutations and  PIK3CA, MYC, BRAF amplification    INTERVAL HISTORY: Mr. Rollene Fare is a 69 year old African-American male, with heavy smoking history, here for follow-up after his hospital discharge. I initially saw him in [facility].  He presented with sudden onset left upper and lower extremity weakness and numbness. He was admitted to the hospital on 11/07/2014. CT had showed numerous hemorrhagic masses consistent with metastatic disease. CT chest abdomen and pelvis showed right middle lobe nodule, extensive mediastinal adenopathy, and bilateral to adrenal gland metastasis. He underwent bronchoscopy and EBUS, and mediastinal  lymph node biopsy showed extensive necrosis, poorly differentiated carcinoma, likely non-small cell. He started whole brain radiation in the hospital, and was discharged to a nursing home.  He has been tolerating brain radiation well, he will finish tomorrow. He is doing some rehabilitation at the facility, do not able to walk very far, his left-sided weakness has improved some, but his gait is on study. He is in wheelchair most of time. He came to my clinic by himself today. His appetite and eating are decent, he denies significant pain, mild shortness breath on exertion, no other discomfort.  INTERIM HISTORY Mr. Falkenstein returns for follow-up. He is a 69 year old African-American male He is accompanied by his mother and sister to the clinic today. He can meet with a wheelchair. He finished whole brain and thoracic radiation on 11/25/2014. He is still in the rehabilitation, able to do physical therapy 2-3 times a day, occasionally walks with a walker for short distance. He still needs assistance for most of his ADLs. His left-sided weakness slightly improved. His appetite is great, eating well. He still on tapering dose steroids. No other new complaints.  REVIEW OF SYSTEMS:   Constitutional: Denies fevers, chills or abnormal weight loss Eyes: Denies blurriness of vision Ears, nose, mouth, throat, and face: Denies mucositis or sore throat Respiratory: Denies cough, dyspnea or wheezes Cardiovascular: Denies palpitation, chest discomfort or lower extremity swelling Gastrointestinal:  Denies nausea, heartburn or change in bowel habits Skin: Denies abnormal skin rashes Lymphatics: Denies new lymphadenopathy or easy bruising Neurological:Denies numbness, tingling, (+) left side weakness  Behavioral/Psych: Mood is stable, no new changes  All other systems were reviewed with the patient and are negative.  MEDICAL HISTORY:  Past Medical History  Diagnosis Date  . Chronic back pain  SURGICAL HISTORY: Past Surgical History  Procedure  Laterality Date  . Endobronchial ultrasound Bilateral 11/11/2014    Procedure: ENDOBRONCHIAL ULTRASOUND;  Surgeon: Juanito Doom, MD;  Location: [facility];  Service: Cardiopulmonary;  Laterality: Bilateral;    I have reviewed the social history and family history with the patient and they are unchanged from previous note.  ALLERGIES:  has No Known Allergies.  MEDICATIONS:  Current Outpatient Prescriptions  Medication Sig Dispense Refill  . acetaminophen (PAIN RELIEF) 325 MG tablet Take 650 mg by mouth every 6 (six) hours as needed for moderate pain.    Marland Kitchen albuterol (PROVENTIL) (2.5 MG/3ML) 0.083% nebulizer solution Take 3 mLs (2.5 mg total) by nebulization every 2 (two) hours as needed for wheezing or shortness of breath. 75 mL 12  . amLODipine (NORVASC) 10 MG tablet Take 1 tablet (10 mg total) by mouth daily. 30 tablet 0  . dexamethasone (DECADRON) 2 MG tablet Take 4 mg PO TID. 40 tablet 0  . isosorbide mononitrate (IMDUR) 30 MG 24 hr tablet Take 0.5 tablets (15 mg total) by mouth daily. 30 tablet 0  . Multiple Vitamin (MULTIVITAMIN WITH MINERALS) TABS tablet Take 1 tablet by mouth daily.    Marland Kitchen oxyCODONE-acetaminophen (PERCOCET/ROXICET) 5-325 MG tablet Take 1 tablet by mouth every 4 (four) hours as needed for severe pain. 40 tablet 0   No current facility-administered medications for this visit.    PHYSICAL EXAMINATION: ECOG PERFORMANCE STATUS: 3 - Symptomatic, >50% confined to bed  Filed Vitals:   12/16/14 1015  BP: 149/60  Pulse: 104  Temp: 98.4 F (36.9 C)  Resp: 18   Filed Weights   12/16/14 1015  Weight: 131 lb 14.4 oz (59.829 kg)    GENERAL:alert, no distress and comfortable, in a wheelchair  SKIN: skin color, texture, turgor are normal, no rashes or significant lesions EYES: normal, Conjunctiva are pink and non-injected, sclera clear OROPHARYNX:no exudate, no erythema and lips, buccal mucosa, and tongue normal  NECK: supple, thyroid normal size, non-tender,  without nodularity LYMPH:  no palpable lymphadenopathy in the cervical, axillary or inguinal LUNGS: clear to auscultation and percussion with normal breathing effort HEART: regular rate & rhythm and no murmurs and no lower extremity edema ABDOMEN:abdomen soft, non-tender and normal bowel sounds Musculoskeletal:no cyanosis of digits and no clubbing  NEURO: alert & oriented x 3 with fluent speech, 4/5 motor strength on left UE and LE.   LABORATORY DATA:  I have reviewed the data as listed CBC Latest Ref Rng 11/14/2014 11/13/2014 11/12/2014  WBC 4.0 - 10.5 K/uL 6.4 6.6 5.5  Hemoglobin 13.0 - 17.0 g/dL 15.0 16.1 16.1  Hematocrit 39.0 - 52.0 % 43.1 45.5 46.6  Platelets 150 - 400 K/uL 191 214 199     CMP Latest Ref Rng 11/14/2014 11/13/2014 11/12/2014  Glucose 65 - 99 mg/dL 140(H) 128(H) 133(H)  BUN 6 - 20 mg/dL 27(H) 24(H) 23(H)  Creatinine 0.61 - 1.24 mg/dL 0.77 0.72 0.79  Sodium 135 - 145 mmol/L 134(L) 133(L) 135  Potassium 3.5 - 5.1 mmol/L 4.2 4.4 4.2  Chloride 101 - 111 mmol/L 102 102 101  CO2 22 - 32 mmol/L _0 Calcium 8.9 - 10.3 mg/dL 9.3 9.2 9.3   PATHOLOGY REPORT Adequacy Reason 11/10/2014  Satisfactory For Evaluation. Diagnosis FINE NEEDLE ASPIRATION, ENDOSCOPIC, EBUS, # 7 NODE, SUBCARINAL,(SPECIMEN 1 OF 1 COLLECTED 11/11/14): ABUNDANT NECROSIS AND RARE MALIGNANT CELLS CONSISENT WITH NON SMALL CELL CARCINOMA. DR. Tresa Moore AGREES. DISCUSSED WITH DR. Lisbeth Renshaw ON 11/14/14. 11/11/2014 Preliminary  Diagnosis Intraoperative Diagnosis: Malignant cells present. Extensive necrosis. [facility])   RADIOGRAPHIC STUDIES: I have personally reviewed the radiological images as listed and agreed with the findings in the report.  CT chest, abdomen and pelvis with contrast 11/07/2014 IMPRESSION: 1. Intrathoracic malignancy, likely bronchogenic carcinoma. There is a right middle lobe pulmonary nodule with right hilar and bilateral mediastinal lymphadenopathy. Bilateral adrenal masses,  presumed metastases. 2. Interstitial lung disease. Granulomatous right upper lobe scarring, likely postinfectious.   CT head without contrast 11/07/2058 IMPRESSION: Numerous brain masses most consistent with hemorrhagic metastases. Associated variable vasogenic edema with mild local mass effect.   ASSESSMENT & PLAN:  69 year old African-American male, with have a smoking history, presented with sudden onset of left-sided weakness.  1. Right middle lobe non-small cell lung cancer, [id number], with diffuse brain metastasis, and bilateral adrenal masses. -I reviewed his CT scan findings, and biopsy results in great details. -We reviewed the incurable nature of his metastatic lung cancer, and overall very poor prognosis. -He has finished whole brain radiation, and has been in the rehabilitation 4 months, with minimal improvement of his neurological function. -Due to his poor performance status, mainly due to his left-sided weakness, and very limited social support, I do not think he is a candidate for cytotoxic chemotherapy.  -His initial biopsy sample was very limited, not able to do any further medical testing. -I discussed his Guardant 360 genomic test results, unfortunately his tumor does not harbor targetable mutations. -I discussed limited systemic treatment options, mainly immunotherapy. Recently the KEYNOTE-024, a phase 3 clinical trial of pembrolizumab and versus chemotherapy as a first-line treatment for metastatic lung cancer with high PDL 1 expression, was reported in [facility] last month. It showed better response rate and longer progression free survival on pembro arm. Unfortunately, pt does not have enough tissue for PD-L1 testing. Giving the 30% advanced NSCLC highly expressed PD-L1, I think is reasonable to try pembro or Nivo in this pt, if I can get his insurance to cover it or get free drug form from surgical company. I spoke with our care management team, and they will work on  it. -Patient and his family members are interest in trying immunotherapy, after we review the benefit and risks/side effects.   -Alternatively, palliative care and hospice would be appropriate for him. We reviewed this with patient and his family members also. -I will see him back in 2 weeks, and tentatively schedule him to start immunotherapy if we can get the drug.   2. Smoking cessation -He has quit smoking since his lung cancer diagnosis.  3. Left-sided weakness -I recommend him to continue rehabilitation  4. Code status -He is agreeable with DNR/DNI, we discussed avoiding hospitalization and intensive care service if he deteriorates from his malignancy.  5. Social support  -He used to live alone, he needs support for his ADLs. The rehabilitation facility he is in now is planning to send him to a independent living, both patient and his mother and sister are very concerned that he cannot live independently. I encouraged him to discuss with the social worker at the rehabilitation center, to see if he is eligible for a long-term resident of nursing home.  Follow up -return in 2 weeks, tentatively to start pembro in 2 weeks  All questions were answered. The patient knows to call the clinic with any problems, questions or concerns. No barriers to learning was detected.  I spent 35 minutes counseling the patient face to face. The total time spent in the  appointment was 40 minutes and more than 50% was on counseling and review of test results     Truitt Merle, MD 12/16/2014 8:31 AM

## 2014-12-17 ENCOUNTER — Encounter: Payer: Self-pay | Admitting: Hematology

## 2014-12-18 ENCOUNTER — Encounter (HOSPITAL_COMMUNITY): Payer: Self-pay

## 2014-12-18 ENCOUNTER — Other Ambulatory Visit: Payer: Self-pay

## 2014-12-18 ENCOUNTER — Inpatient Hospital Stay (HOSPITAL_COMMUNITY)
Admission: EM | Admit: 2014-12-18 | Discharge: 2015-01-12 | DRG: 871 | Disposition: E | Payer: Medicare (Managed Care) | Attending: Internal Medicine | Admitting: Internal Medicine

## 2014-12-18 ENCOUNTER — Inpatient Hospital Stay (HOSPITAL_COMMUNITY): Payer: Medicare (Managed Care)

## 2014-12-18 ENCOUNTER — Emergency Department (HOSPITAL_COMMUNITY): Payer: Medicare (Managed Care)

## 2014-12-18 DIAGNOSIS — E860 Dehydration: Secondary | ICD-10-CM | POA: Diagnosis present

## 2014-12-18 DIAGNOSIS — J440 Chronic obstructive pulmonary disease with acute lower respiratory infection: Secondary | ICD-10-CM | POA: Diagnosis present

## 2014-12-18 DIAGNOSIS — R740 Nonspecific elevation of levels of transaminase and lactic acid dehydrogenase [LDH]: Secondary | ICD-10-CM | POA: Diagnosis present

## 2014-12-18 DIAGNOSIS — R0682 Tachypnea, not elsewhere classified: Secondary | ICD-10-CM | POA: Diagnosis present

## 2014-12-18 DIAGNOSIS — Z66 Do not resuscitate: Secondary | ICD-10-CM | POA: Diagnosis present

## 2014-12-18 DIAGNOSIS — N179 Acute kidney failure, unspecified: Secondary | ICD-10-CM

## 2014-12-18 DIAGNOSIS — N390 Urinary tract infection, site not specified: Secondary | ICD-10-CM | POA: Diagnosis present

## 2014-12-18 DIAGNOSIS — R509 Fever, unspecified: Secondary | ICD-10-CM

## 2014-12-18 DIAGNOSIS — C342 Malignant neoplasm of middle lobe, bronchus or lung: Secondary | ICD-10-CM | POA: Diagnosis present

## 2014-12-18 DIAGNOSIS — Z720 Tobacco use: Secondary | ICD-10-CM

## 2014-12-18 DIAGNOSIS — F17210 Nicotine dependence, cigarettes, uncomplicated: Secondary | ICD-10-CM | POA: Diagnosis present

## 2014-12-18 DIAGNOSIS — Z515 Encounter for palliative care: Secondary | ICD-10-CM | POA: Diagnosis not present

## 2014-12-18 DIAGNOSIS — I10 Essential (primary) hypertension: Secondary | ICD-10-CM | POA: Diagnosis present

## 2014-12-18 DIAGNOSIS — E875 Hyperkalemia: Secondary | ICD-10-CM | POA: Diagnosis present

## 2014-12-18 DIAGNOSIS — F1010 Alcohol abuse, uncomplicated: Secondary | ICD-10-CM | POA: Diagnosis present

## 2014-12-18 DIAGNOSIS — Y95 Nosocomial condition: Secondary | ICD-10-CM | POA: Diagnosis present

## 2014-12-18 DIAGNOSIS — R109 Unspecified abdominal pain: Secondary | ICD-10-CM | POA: Diagnosis present

## 2014-12-18 DIAGNOSIS — Z681 Body mass index (BMI) 19 or less, adult: Secondary | ICD-10-CM

## 2014-12-18 DIAGNOSIS — C7931 Secondary malignant neoplasm of brain: Secondary | ICD-10-CM | POA: Diagnosis present

## 2014-12-18 DIAGNOSIS — C3491 Malignant neoplasm of unspecified part of right bronchus or lung: Secondary | ICD-10-CM | POA: Diagnosis present

## 2014-12-18 DIAGNOSIS — G9340 Encephalopathy, unspecified: Secondary | ICD-10-CM | POA: Diagnosis not present

## 2014-12-18 DIAGNOSIS — C3490 Malignant neoplasm of unspecified part of unspecified bronchus or lung: Secondary | ICD-10-CM | POA: Diagnosis present

## 2014-12-18 DIAGNOSIS — A419 Sepsis, unspecified organism: Secondary | ICD-10-CM | POA: Diagnosis not present

## 2014-12-18 DIAGNOSIS — E279 Disorder of adrenal gland, unspecified: Secondary | ICD-10-CM | POA: Diagnosis present

## 2014-12-18 DIAGNOSIS — R4182 Altered mental status, unspecified: Secondary | ICD-10-CM | POA: Diagnosis not present

## 2014-12-18 DIAGNOSIS — J441 Chronic obstructive pulmonary disease with (acute) exacerbation: Secondary | ICD-10-CM | POA: Diagnosis present

## 2014-12-18 DIAGNOSIS — Z923 Personal history of irradiation: Secondary | ICD-10-CM

## 2014-12-18 DIAGNOSIS — F17200 Nicotine dependence, unspecified, uncomplicated: Secondary | ICD-10-CM | POA: Diagnosis present

## 2014-12-18 DIAGNOSIS — G9349 Other encephalopathy: Secondary | ICD-10-CM | POA: Diagnosis present

## 2014-12-18 DIAGNOSIS — E43 Unspecified severe protein-calorie malnutrition: Secondary | ICD-10-CM | POA: Diagnosis present

## 2014-12-18 DIAGNOSIS — R64 Cachexia: Secondary | ICD-10-CM | POA: Diagnosis present

## 2014-12-18 DIAGNOSIS — J189 Pneumonia, unspecified organism: Secondary | ICD-10-CM | POA: Diagnosis present

## 2014-12-18 DIAGNOSIS — J449 Chronic obstructive pulmonary disease, unspecified: Secondary | ICD-10-CM | POA: Diagnosis present

## 2014-12-18 HISTORY — DX: Essential (primary) hypertension: I10

## 2014-12-18 HISTORY — DX: Chronic obstructive pulmonary disease, unspecified: J44.9

## 2014-12-18 LAB — COMPREHENSIVE METABOLIC PANEL
ALK PHOS: 74 U/L (ref 38–126)
ALT: 39 U/L (ref 17–63)
AST: 59 U/L — ABNORMAL HIGH (ref 15–41)
Albumin: 2.3 g/dL — ABNORMAL LOW (ref 3.5–5.0)
Anion gap: 17 — ABNORMAL HIGH (ref 5–15)
BUN: 27 mg/dL — ABNORMAL HIGH (ref 6–20)
CO2: 20 mmol/L — AB (ref 22–32)
Calcium: 8.6 mg/dL — ABNORMAL LOW (ref 8.9–10.3)
Chloride: 97 mmol/L — ABNORMAL LOW (ref 101–111)
Creatinine, Ser: 1.87 mg/dL — ABNORMAL HIGH (ref 0.61–1.24)
GFR calc non Af Amer: 35 mL/min — ABNORMAL LOW (ref >60)
GFR, EST AFRICAN AMERICAN: 41 mL/min — AB (ref >60)
Glucose, Bld: 227 mg/dL — ABNORMAL HIGH (ref 65–99)
Potassium: 5.6 mmol/L — ABNORMAL HIGH (ref 3.5–5.1)
SODIUM: 134 mmol/L — AB (ref 135–145)
TOTAL PROTEIN: 6.3 g/dL — AB (ref 6.5–8.1)
Total Bilirubin: 1.9 mg/dL — ABNORMAL HIGH (ref 0.3–1.2)

## 2014-12-18 LAB — CBC WITH DIFFERENTIAL/PLATELET
BASOS ABS: 0 10*3/uL (ref 0.0–0.1)
BASOS PCT: 0 %
EOS ABS: 0 10*3/uL (ref 0.0–0.7)
EOS PCT: 0 %
HCT: 43.5 % (ref 39.0–52.0)
Hemoglobin: 15.3 g/dL (ref 13.0–17.0)
Lymphocytes Relative: 5 %
Lymphs Abs: 0.5 10*3/uL — ABNORMAL LOW (ref 0.7–4.0)
MCH: 31.3 pg (ref 26.0–34.0)
MCHC: 35.2 g/dL (ref 30.0–36.0)
MCV: 89 fL (ref 78.0–100.0)
MONO ABS: 0.4 10*3/uL (ref 0.1–1.0)
Monocytes Relative: 5 %
NEUTROS ABS: 8 10*3/uL — AB (ref 1.7–7.7)
Neutrophils Relative %: 90 %
PLATELETS: 75 10*3/uL — AB (ref 150–400)
RBC: 4.89 MIL/uL (ref 4.22–5.81)
RDW: 13.5 % (ref 11.5–15.5)
WBC: 9 10*3/uL (ref 4.0–10.5)

## 2014-12-18 LAB — LACTIC ACID, PLASMA: LACTIC ACID, VENOUS: 3.9 mmol/L — AB (ref 0.5–2.0)

## 2014-12-18 LAB — I-STAT CHEM 8, ED
BUN: 40 mg/dL — ABNORMAL HIGH (ref 6–20)
Calcium, Ion: 0.99 mmol/L — ABNORMAL LOW (ref 1.13–1.30)
Chloride: 99 mmol/L — ABNORMAL LOW (ref 101–111)
Creatinine, Ser: 1.7 mg/dL — ABNORMAL HIGH (ref 0.61–1.24)
Glucose, Bld: 227 mg/dL — ABNORMAL HIGH (ref 65–99)
HEMATOCRIT: 47 % (ref 39.0–52.0)
HEMOGLOBIN: 16 g/dL (ref 13.0–17.0)
POTASSIUM: 5.5 mmol/L — AB (ref 3.5–5.1)
Sodium: 131 mmol/L — ABNORMAL LOW (ref 135–145)
TCO2: 22 mmol/L (ref 0–100)

## 2014-12-18 LAB — RAPID URINE DRUG SCREEN, HOSP PERFORMED
Amphetamines: NOT DETECTED
BARBITURATES: NOT DETECTED
Benzodiazepines: NOT DETECTED
COCAINE: NOT DETECTED
Opiates: NOT DETECTED
Tetrahydrocannabinol: NOT DETECTED

## 2014-12-18 LAB — PROTIME-INR
INR: 1.73 — AB (ref 0.00–1.49)
Prothrombin Time: 20.2 seconds — ABNORMAL HIGH (ref 11.6–15.2)

## 2014-12-18 LAB — I-STAT CG4 LACTIC ACID, ED: LACTIC ACID, VENOUS: 4.89 mmol/L — AB (ref 0.5–2.0)

## 2014-12-18 LAB — URINALYSIS, ROUTINE W REFLEX MICROSCOPIC
GLUCOSE, UA: NEGATIVE mg/dL
Ketones, ur: 15 mg/dL — AB
Nitrite: NEGATIVE
Protein, ur: 100 mg/dL — AB
SPECIFIC GRAVITY, URINE: 1.019 (ref 1.005–1.030)
UROBILINOGEN UA: 1 mg/dL (ref 0.0–1.0)
pH: 5 (ref 5.0–8.0)

## 2014-12-18 LAB — PROCALCITONIN: Procalcitonin: 16.77 ng/mL

## 2014-12-18 LAB — MRSA PCR SCREENING: MRSA by PCR: NEGATIVE

## 2014-12-18 LAB — LIPASE, BLOOD: LIPASE: 17 U/L (ref 11–51)

## 2014-12-18 LAB — SODIUM, URINE, RANDOM: Sodium, Ur: 55 mmol/L

## 2014-12-18 LAB — INFLUENZA PANEL BY PCR (TYPE A & B)
H1N1 flu by pcr: NOT DETECTED
Influenza A By PCR: NEGATIVE
Influenza B By PCR: NEGATIVE

## 2014-12-18 LAB — APTT: aPTT: 32 seconds (ref 24–37)

## 2014-12-18 LAB — URINE MICROSCOPIC-ADD ON

## 2014-12-18 LAB — CREATININE, URINE, RANDOM: CREATININE, URINE: 117.12 mg/dL

## 2014-12-18 LAB — STREP PNEUMONIAE URINARY ANTIGEN: STREP PNEUMO URINARY ANTIGEN: NEGATIVE

## 2014-12-18 MED ORDER — HYDROMORPHONE HCL 1 MG/ML IJ SOLN
0.2500 mg | INTRAMUSCULAR | Status: DC | PRN
  Administered 2014-12-18: 0.5 mg via INTRAVENOUS
  Filled 2014-12-18: qty 1

## 2014-12-18 MED ORDER — LORAZEPAM 2 MG/ML IJ SOLN: 0.0000 mg | Freq: Two times a day (BID) | INTRAMUSCULAR | Status: DC

## 2014-12-18 MED ORDER — LORAZEPAM 2 MG/ML IJ SOLN: 0.5000 mg | INTRAMUSCULAR | Status: DC | PRN

## 2014-12-18 MED ORDER — ACETAMINOPHEN 325 MG PO TABS: 650.0000 mg | ORAL_TABLET | Freq: Four times a day (QID) | ORAL | Status: DC | PRN

## 2014-12-18 MED ORDER — ENSURE ENLIVE PO LIQD
237.0000 mL | Freq: Two times a day (BID) | ORAL | Status: DC
Start: 2014-12-18 — End: 2014-12-18
  Filled 2014-12-18: qty 237

## 2014-12-18 MED ORDER — VANCOMYCIN HCL IN DEXTROSE 1-5 GM/200ML-% IV SOLN
1000.0000 mg | Freq: Once | INTRAVENOUS | Status: AC
  Administered 2014-12-18: 1000 mg via INTRAVENOUS
  Filled 2014-12-18: qty 200

## 2014-12-18 MED ORDER — THIAMINE HCL 100 MG/ML IJ SOLN: 100.0000 mg | Freq: Every day | INTRAMUSCULAR | Status: DC

## 2014-12-18 MED ORDER — SODIUM CHLORIDE 0.9 % IV SOLN: INTRAVENOUS | Status: DC

## 2014-12-18 MED ORDER — PIPERACILLIN-TAZOBACTAM 3.375 G IVPB
3.3750 g | Freq: Three times a day (TID) | INTRAVENOUS | Status: DC
  Administered 2014-12-18: 3.375 g via INTRAVENOUS
  Filled 2014-12-18 (×2): qty 50

## 2014-12-18 MED ORDER — ADULT MULTIVITAMIN W/MINERALS CH: 1.0000 | ORAL_TABLET | Freq: Every day | ORAL | Status: DC

## 2014-12-18 MED ORDER — DM-GUAIFENESIN ER 30-600 MG PO TB12
1.0000 | ORAL_TABLET | Freq: Two times a day (BID) | ORAL | Status: DC
  Filled 2014-12-18 (×2): qty 1

## 2014-12-18 MED ORDER — LORAZEPAM 2 MG/ML IJ SOLN: 1.0000 mg | Freq: Four times a day (QID) | INTRAMUSCULAR | Status: DC | PRN

## 2014-12-18 MED ORDER — VITAMIN B-1 100 MG PO TABS: 100.0000 mg | ORAL_TABLET | Freq: Every day | ORAL | Status: DC

## 2014-12-18 MED ORDER — PIPERACILLIN-TAZOBACTAM 3.375 G IVPB
3.3750 g | Freq: Three times a day (TID) | INTRAVENOUS | Status: DC
Start: 2014-12-18 — End: 2014-12-18
  Filled 2014-12-18: qty 50

## 2014-12-18 MED ORDER — SODIUM POLYSTYRENE SULFONATE 15 GM/60ML PO SUSP: 30.0000 g | Freq: Once | ORAL | Status: DC

## 2014-12-18 MED ORDER — OXYCODONE-ACETAMINOPHEN 5-325 MG PO TABS: 1.0000 | ORAL_TABLET | ORAL | Status: DC | PRN

## 2014-12-18 MED ORDER — VANCOMYCIN HCL IN DEXTROSE 750-5 MG/150ML-% IV SOLN
750.0000 mg | INTRAVENOUS | Status: DC
  Filled 2014-12-18: qty 150

## 2014-12-18 MED ORDER — LORAZEPAM 2 MG/ML IJ SOLN: 0.0000 mg | Freq: Four times a day (QID) | INTRAMUSCULAR | Status: DC

## 2014-12-18 MED ORDER — FOLIC ACID 1 MG PO TABS: 1.0000 mg | ORAL_TABLET | Freq: Every day | ORAL | Status: DC

## 2014-12-18 MED ORDER — SODIUM CHLORIDE 0.9 % IV BOLUS (SEPSIS)
2500.0000 mL | Freq: Once | INTRAVENOUS | Status: AC
  Administered 2014-12-18: 2500 mL via INTRAVENOUS

## 2014-12-18 MED ORDER — IPRATROPIUM-ALBUTEROL 0.5-2.5 (3) MG/3ML IN SOLN: 3.0000 mL | RESPIRATORY_TRACT | Status: DC | PRN

## 2014-12-18 MED ORDER — NICOTINE 21 MG/24HR TD PT24
21.0000 mg | MEDICATED_PATCH | Freq: Every day | TRANSDERMAL | Status: DC
  Administered 2014-12-18: 21 mg via TRANSDERMAL
  Filled 2014-12-18: qty 1

## 2014-12-18 MED ORDER — LORAZEPAM 1 MG PO TABS: 1.0000 mg | ORAL_TABLET | Freq: Four times a day (QID) | ORAL | Status: DC | PRN

## 2014-12-18 MED ORDER — HYDRALAZINE HCL 20 MG/ML IJ SOLN
5.0000 mg | INTRAMUSCULAR | Status: DC | PRN
Start: 2014-12-18 — End: 2014-12-18

## 2014-12-18 MED ORDER — PIPERACILLIN-TAZOBACTAM 3.375 G IVPB
3.3750 g | Freq: Once | INTRAVENOUS | Status: AC
  Administered 2014-12-18: 3.375 g via INTRAVENOUS
  Filled 2014-12-18: qty 50

## 2014-12-19 LAB — LEGIONELLA PNEUMOPHILA SEROGP 1 UR AG: L. PNEUMOPHILA SEROGP 1 UR AG: NEGATIVE

## 2014-12-19 LAB — URINE CULTURE: SPECIAL REQUESTS: NORMAL

## 2014-12-23 LAB — CULTURE, BLOOD (ROUTINE X 2): Culture: NO GROWTH

## 2014-12-30 ENCOUNTER — Ambulatory Visit: Payer: Medicare (Managed Care)

## 2014-12-30 ENCOUNTER — Ambulatory Visit: Payer: Medicare (Managed Care) | Admitting: Hematology

## 2014-12-30 ENCOUNTER — Other Ambulatory Visit: Payer: Medicare (Managed Care)

## 2015-01-03 DIAGNOSIS — C3490 Malignant neoplasm of unspecified part of unspecified bronchus or lung: Secondary | ICD-10-CM | POA: Diagnosis present

## 2015-01-03 DIAGNOSIS — J441 Chronic obstructive pulmonary disease with (acute) exacerbation: Secondary | ICD-10-CM | POA: Diagnosis present

## 2015-01-03 DIAGNOSIS — Z720 Tobacco use: Secondary | ICD-10-CM | POA: Diagnosis present

## 2015-01-12 ENCOUNTER — Ambulatory Visit: Payer: Self-pay | Admitting: Radiation Oncology

## 2015-01-12 NOTE — ED Notes (Addendum)
[place]. Diagnosed with Pneumonia today. Facility stated that he was fine earlier today and then tonight became altered. Pt altered upon arrival but following simple commands on occasion. Pt oriented to self. Pt received breathing treatment '5mg'$  albuterol and 0.5 atrovent. Respiratory effort decreased but not using accessory muscles like before. Breath sounds decreased in lower lobes and rhonchi in right lower lobes and has a hx of copd. Pt has 18 in left AC. 12 lead shows peaked t-waves sinus tach. Pt was receiving a liter of fluid at the facility for urinary retention.

## 2015-01-12 NOTE — Progress Notes (Signed)
ANTIBIOTIC CONSULT NOTE - INITIAL  Pharmacy Consult for Vancomycin and Zosyn Indication: sepsis (HCAP, UTI)  No Known Allergies  Patient Measurements: Height: '5\' 10"'$  (177.8 cm) Weight: 131 lb (59.421 kg) IBW/kg (Calculated) : 73  Vital Signs: Temp: 102.8 F (39.3 C) (11/06 0147) Temp Source: Rectal (11/06 0147) BP: 155/63 mmHg (11/06 0315) Pulse Rate: 116 (11/06 0315) Intake/Output from previous day:   Intake/Output from this shift:    Labs:  Recent Labs  2015-01-15 0133 01/15/15 0142  WBC 9.0  --   HGB 15.3 16.0  PLT 75*  --   CREATININE 1.87* 1.70*   Estimated Creatinine Clearance: 34.9 mL/min (by C-G formula based on Cr of 1.7). No results for input(s): VANCOTROUGH, VANCOPEAK, VANCORANDOM, GENTTROUGH, GENTPEAK, GENTRANDOM, TOBRATROUGH, TOBRAPEAK, TOBRARND, AMIKACINPEAK, AMIKACINTROU, AMIKACIN in the last 72 hours.   Microbiology: No results found for this or any previous visit (from the past 720 hour(s)).  Medical History: Past Medical History  Diagnosis Date  . Chronic back pain   . COPD (chronic obstructive pulmonary disease) ([place])   . Hypertension     Medications:  See electronic med rec  Assessment: 69 y.o. M presents from NH with AMS. Pt receiving radiation for lung cancer. Diagnosed with PNA 11/5 and started on Levaquin po at [facility]. WBC wnl. Tm 102.8. LA 4.89. SCr 1.7, est CrCl 35 ml/min.   Zosyn 3.375gm IV and Vanc 1gm IV given in ED ~0140. To continue Vanc and Zosyn for sepsis (HCAP and UTI).  Goal of Therapy:  Vancomycin trough level 15-20 mcg/ml  Plan:  Zosyn 3.375gm IV q8h - subsequent doses over 4 hours Vancomycin '750mg'$  IV q24h Will f/u micro data, renal function, and pt's clinical condition Vanc trough prn  Sherlon Handing, PharmD, BCPS Clinical pharmacist, pager [phone] 01-15-2015,4:11 AM

## 2015-01-12 NOTE — ED Provider Notes (Signed)
CSN: [id number]     Arrival date & time 20-Dec-2014  0109 History  By signing my name below, I, Hansel Feinstein, attest that this documentation has been prepared under the direction and in the presence of Veryl Speak, MD. Electronically Signed: Hansel Feinstein, ED Scribe. Dec 20, 2014. 1:32 AM.    Chief Complaint  Patient presents with  . Altered Mental Status   Patient is a 69 y.o. male presenting with altered mental status. The history is provided by the nursing home and the EMS personnel. The history is limited by the condition of the patient. No language interpreter was used.  Altered Mental Status Presenting symptoms: partial responsiveness   Severity:  Moderate Most recent episode:  Today Episode history:  Continuous Duration:  1 hour Chronicity:  New Context: nursing home resident and recent illness     LEVEL 5 CAVEAT: HPI and ROS limited due to AMS   HPI Comments: Casey Charles is a 69 y.o. male who presents to the Emergency Department due to AMS and decreased responsiveness. Pt is from [facility] and [place]. Pt was dx with Pneumonia yesterday. Last seen at baseline 1 hour ago. Facility reports that he was tachy and had a rectal temp of 101. 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 Pt has a foley and has not voided into his catheter. Full code status.   Past Medical History  Diagnosis Date  . Chronic back pain   . COPD (chronic obstructive pulmonary disease) ([place])   . Hypertension    Past Surgical History  Procedure Laterality Date  . Endobronchial ultrasound Bilateral 11/11/2014    Procedure: ENDOBRONCHIAL ULTRASOUND;  Surgeon: Juanito Doom, MD;  Location: [facility];  Service: Cardiopulmonary;  Laterality: Bilateral;   History reviewed. No pertinent family history. Social History  Substance Use Topics  . Smoking status: Current Every Day Smoker -- 1.00 packs/day    Types: Cigarettes  . Smokeless tobacco: None  . Alcohol Use: Yes     Comment: daily     Review of Systems  Unable to perform ROS: Mental status change   Allergies  Review of patient's allergies indicates no known allergies.  Home Medications   Prior to Admission medications   Medication Sig Start Date End Date Taking? Authorizing Provider  acetaminophen (PAIN RELIEF) 325 MG tablet Take 650 mg by mouth every 6 (six) hours as needed for moderate pain.    Historical Provider, MD  amLODipine (NORVASC) 10 MG tablet Take 1 tablet (10 mg total) by mouth daily. 11/14/14   Kelvin Cellar, MD  dexamethasone (DECADRON) 2 MG tablet Take 4 mg PO TID. Patient taking differently: 2 mg 3 (three) times daily. Take 4 mg PO TID. 11/16/14   Kyung Rudd, MD  isosorbide mononitrate (IMDUR) 30 MG 24 hr tablet Take 0.5 tablets (15 mg total) by mouth daily. 11/14/14   Kelvin Cellar, MD  Multiple Vitamin (MULTIVITAMIN WITH MINERALS) TABS tablet Take 1 tablet by mouth daily.    Historical Provider, MD  oxyCODONE-acetaminophen (PERCOCET/ROXICET) 5-325 MG tablet Take 1 tablet by mouth every 4 (four) hours as needed for severe pain. 11/16/14   Kyung Rudd, MD   BP 106/41 mmHg  Pulse 133  Temp(Src) 102.8 F (39.3 C) (Rectal)  Resp 24  Ht '5\' 10"'$  (1.778 m)  Wt 131 lb (59.421 kg)  BMI 18.80 kg/m2  SpO2 94% Physical Exam  Constitutional:  Pt is an elderly male. Appears acutely on chronically ill.   HENT:  Head: Normocephalic and  atraumatic.  Neck: Normal range of motion.  Cardiovascular: Normal rate, regular rhythm, normal heart sounds and intact distal pulses.   Pulmonary/Chest: Effort normal and breath sounds normal. No respiratory distress.  Abdominal: Soft. He exhibits no distension. There is no tenderness.  Musculoskeletal: Normal range of motion.  Neurological:  He will open his eyes and look in my direction when spoken to, however drifts back to sleep.   Skin: Skin is warm and dry.  Nursing note and vitals reviewed.  ED Course  Procedures (including critical care time) DIAGNOSTIC  STUDIES: Oxygen Saturation is 94% on [place] 2L/min, adequate by my interpretation.    COORDINATION OF CARE: 1:29 AM Discussed treatment plan with pt at bedside and pt agreed to plan.   Labs Review Labs Reviewed  CULTURE, BLOOD (ROUTINE X 2)  CULTURE, BLOOD (ROUTINE X 2)  URINE CULTURE  COMPREHENSIVE METABOLIC PANEL  CBC WITH DIFFERENTIAL/PLATELET  URINALYSIS, ROUTINE W REFLEX MICROSCOPIC (NOT AT [facility])  I-STAT CHEM 8, ED  I-STAT CG4 LACTIC ACID, ED    Imaging Review No results found. I have personally reviewed and evaluated these images and lab results as part of my medical decision-making.   EKG Interpretation None      MDM   Final diagnoses:  None    Patient arrived to the emergency department initially febrile and tachycardic. He was recently diagnosed with pneumonia and started on antibiotics. Septic workup was initiated including blood cultures, chest x-ray, and urine culture. This revealed a multifocal pneumonia and the patient was treated with vancomycin and Zosyn. I've spoken with the hospitalist who will admit the patient. I did speak to the family personally regarding this patient's prognosis. He has a history of metastatic lung cancer to the brain and we are all in agreement that we will treat his pneumonia, and provide supportive care, however we will not consider intubation or CPR.  CRITICAL CARE Performed by: Veryl Speak Total critical care time: 45 minutes Critical care time was exclusive of separately billable procedures and treating other patients. Critical care was necessary to treat or prevent imminent or life-threatening deterioration. Critical care was time spent personally by me on the following activities: development of treatment plan with patient and/or surrogate as well as nursing, discussions with consultants, evaluation of patient's response to treatment, examination of patient, obtaining history from patient or surrogate, ordering and performing  treatments and interventions, ordering and review of laboratory studies, ordering and review of radiographic studies, pulse oximetry and re-evaluation of patient's condition.   I personally performed the services described in this documentation, which was scribed in my presence. The recorded information has been reviewed and is accurate.       Veryl Speak, MD 12/21/14 1929

## 2015-01-12 NOTE — Consult Note (Signed)
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 He has greater than 25 hemorrhagic lesions in his brain. CXR concerning for bibasilar opacities, lactic acid 4.89, pro calcitonin 16.77. His bllod pressure has been dropping all morning. He is moaning in pain and is tachypnea with rate 32-40. BP now 65/40. Met with pt's mother Mrs. Laurance Flatten and his sister Mrs. Presley. Pt is actively dying. Prepared them for a prognosis of just hours. Family tearful but verbalized understanding  Contacts/Participants in Discussion: Primary Decision Maker: Mrs. Pleas Patricia Relationship to Patient mother HCPOA: yes  Met with Mrs. Laurance Flatten and Mrs. Presley  SUMMARY OF RECOMMENDATIONS Pt is actively dying Focus on comfort Stop abx, KVO fluids All RX geared towards comfort Anticipate a hospital death in just a matter of hours DO not 02 delivery beyond [place]  Code Status/Advance Care Planning: DNR    Code Status Orders        Start     Ordered   01-03-15 0408  Do not attempt resuscitation (DNR)   Continuous    Question Answer Comment  In the event of cardiac or respiratory ARREST Do not call a "code blue"   In the event of cardiac or respiratory ARREST Do not perform Intubation, CPR, defibrillation or ACLS   In the event of cardiac or respiratory ARREST Use medication by any route, position, wound care, and other measures to relive pain and suffering. May use oxygen, suction and manual treatment of airway obstruction as  needed for comfort.      2015/01/03 0408        Symptom Management:   Pain: Pt unable to take anything my mouth. Add dilaudid .25-.5 q2 prn  Dyspnea: See above. DO NOT advance 02 beyond nasal canula  Secretions: Robinul q4 prn  Anxiety: Ativan q4 prn  Palliative Prophylaxis:   Frequent Pain Assessment and Oral Care  Additional Recommendations (Limitations, Scope, Preferences):  Full Comfort Care  Psycho-social/Spiritual:  Support System: Strong Desire for further Chaplaincy support:no   Prognosis: Hours - Days  Discharge Planning: Anticipate hospital death   Chief Complaint/ Primary Diagnoses: Present on Admission:  . HCAP (healthcare-associated pneumonia) . Non-small cell cancer of right lung ([place]) . Protein-calorie malnutrition, severe ([place]) . ETOH abuse . Current smoker . COPD (chronic obstructive pulmonary disease) ([place]) . Acute encephalopathy . Sepsis ([place]) . UTI (lower urinary tract infection) . AKI (acute kidney injury) ([place]) . Hyperkalemia . Hypertension . Abdominal pain  I have reviewed the medical record, interviewed the patient and family, and examined the patient. The following aspects are pertinent.  Past Medical History  Diagnosis Date  . Chronic back pain   . COPD (chronic obstructive pulmonary disease) ([place])   . Hypertension    Social History   Social History  . Marital Status: Single    Spouse Name: N/A  . Number of Children: N/A  . Years of Education: N/A   Social History Main Topics  . Smoking status: Current Every Day Smoker -- 1.00 packs/day    Types: Cigarettes  . Smokeless tobacco: None  .  Alcohol Use: Yes     Comment: daily  . Drug Use: No  . Sexual Activity: Yes   Other Topics Concern  . None   Social History Narrative   History reviewed. No pertinent family history. Scheduled Meds: . nicotine  21 mg Transdermal Daily   Continuous Infusions: . sodium chloride 125 mL/hr at December 21, 2014 0656   PRN  Meds:.hydrALAZINE, HYDROmorphone (DILAUDID) injection, ipratropium-albuterol, LORazepam Medications Prior to Admission:  Prior to Admission medications   Medication Sig Start Date End Date Taking? Authorizing Provider  amLODipine (NORVASC) 10 MG tablet Take 1 tablet (10 mg total) by mouth daily. 11/14/14  Yes Kelvin Cellar, MD  ipratropium-albuterol (DUONEB) 0.5-2.5 (3) MG/3ML SOLN Take 3 mLs by nebulization every 6 (six) hours.   Yes Historical Provider, MD  isosorbide mononitrate (IMDUR) 30 MG 24 hr tablet Take 0.5 tablets (15 mg total) by mouth daily. 11/14/14  Yes Kelvin Cellar, MD  levofloxacin (LEVAQUIN) 750 MG tablet Take 750 mg by mouth daily. For 7 days starting 12/17/14 12/17/14 12/23/14 Yes Historical Provider, MD  Multiple Vitamin (MULTIVITAMIN WITH MINERALS) TABS tablet Take 1 tablet by mouth daily.   Yes Historical Provider, MD  nicotine (NICODERM CQ - DOSED IN MG/24 HOURS) 21 mg/24hr patch Place 21 mg onto the skin daily.   Yes Historical Provider, MD  acetaminophen (PAIN RELIEF) 325 MG tablet Take 650 mg by mouth every 6 (six) hours as needed for moderate pain.    Historical Provider, MD  dexamethasone (DECADRON) 2 MG tablet Take 4 mg PO TID. Patient not taking: Reported on 21-Dec-2014 11/16/14   Kyung Rudd, MD  oxyCODONE-acetaminophen (PERCOCET/ROXICET) 5-325 MG tablet Take 1 tablet by mouth every 4 (four) hours as needed for severe pain. 11/16/14   Kyung Rudd, MD   No Known Allergies CBC:    Component Value Date/Time   WBC 9.0 12-21-2014 0133   HGB 16.0 2014-12-21 0142   HCT 47.0 12/21/14 0142   PLT 75* Dec 21, 2014 0133   MCV 89.0 2014/12/21 0133   NEUTROABS 8.0* Dec 21, 2014 0133   LYMPHSABS 0.5* 12/21/2014 0133   MONOABS 0.4 12/21/2014 0133   EOSABS 0.0 Dec 21, 2014 0133   BASOSABS 0.0 December 21, 2014 0133   Comprehensive Metabolic Panel:    Component Value Date/Time   NA 131* 12-21-14 0142   K 5.5* 12-21-14 0142   CL 99* Dec 21, 2014 0142   CO2 20* 21-Dec-2014 0133    BUN 40* 12/21/14 0142   CREATININE 1.70* 12/21/2014 0142   GLUCOSE 227* December 21, 2014 0142   CALCIUM 8.6* 12-21-14 0133   AST 59* 12-21-14 0133   ALT 39 12/21/2014 0133   ALKPHOS 74 Dec 21, 2014 0133   BILITOT 1.9* 12-21-14 0133   PROT 6.3* 12/21/2014 0133   ALBUMIN 2.3* 12/21/2014 0133    Review of Systems  Unable to perform ROS: Acuity of condition    Physical Exam  Constitutional:  Cachetic. Appears to be actively dying  Cardiovascular:  Tachy bounding  Respiratory: He is in respiratory distress.  GI: Soft.  Genitourinary:  foley  Neurological:  unresponsive  Skin:  Cool, mottled    Vital Signs: BP 93/48 mmHg  Pulse 139  Temp(Src) 100 F (37.8 C) (Axillary)  Resp 34  Ht _0  (1.778 m)  Wt 63.1 kg (139 lb 1.8 oz)  BMI 19.96 kg/m2  SpO2 95% SpO2: Last BM Date: 2014/12/21  O2 Device:SpO2: 95 % O2 Flow Rate: .O2 Flow Rate (L/min): 3 L/min Intake/output summary:  Intake/Output Summary (Last 24 hours) at 21-Dec-2014 1044 Last data  filed at 2015-01-11 0900  Gross per 24 hour  Intake 308.33 ml  Output     20 ml  Net 288.33 ml   LBM:  BMP Latest Ref Rng Jan 11, 2015 Jan 11, 2015 11/14/2014  Glucose 65 - 99 mg/dL 227(H) 227(H) 140(H)  BUN 6 - 20 mg/dL 40(H) 27(H) 27(H)  Creatinine 0.61 - 1.24 mg/dL 1.70(H) 1.87(H) 0.77  Sodium 135 - 145 mmol/L 131(L) 134(L) 134(L)  Potassium 3.5 - 5.1 mmol/L 5.5(H) 5.6(H) 4.2  Chloride 101 - 111 mmol/L 99(L) 97(L) 102  CO2 22 - 32 mmol/L - 20(L) 22  Calcium 8.9 - 10.3 mg/dL - 8.6(L) 9.3    Baseline Weight: Weight: 59.421 kg (131 lb) Most recent weight: Weight: 63.1 kg (139 lb 1.8 oz)      Palliative Assessment/Data:  Flowsheet Rows        Most Recent Value   Intake Tab    Referral Department  Hospitalist   Unit at Time of Referral  Intermediate Care Unit   Palliative Care Primary Diagnosis  Cancer   Date Notified  January 11, 2015   Palliative Care Type  Return patient Palliative Care   Reason for referral  Clarify Goals of Care,  Pain, Non-pain Symptom   Date of Admission  Jan 11, 2015   Date first seen by Palliative Care  Jan 11, 2015   # of days Palliative referral response time  0 Day(s)   # of days IP prior to Palliative referral  0   Clinical Assessment    Palliative Performance Scale Score  10%   Pain Max last 24 hours  Not able to report   Pain Min Last 24 hours  Not able to report   Dyspnea Max Last 24 Hours  Not able to report   Dyspnea Min Last 24 hours  Not able to report   Nausea Max Last 24 Hours  Not able to report   Nausea Min Last 24 Hours  Not able to report   Anxiety Max Last 24 Hours  Not able to report   Anxiety Min Last 24 Hours  Not able to report   Other Max Last 24 Hours  Not able to report   Psychosocial & Spiritual Assessment    Palliative Care Outcomes    Patient/Family meeting held?  Yes   Palliative Care Outcomes  Improved pain interventions, Improved non-pain symptom therapy, Clarified goals of care, Changed to focus on comfort   Patient/Family wishes: Interventions discontinued/not started   Mechanical Ventilation, BiPAP, Hemodialysis, Transfusion, Vasopressors, Antibiotics, NIPPV, Trach, PEG   Palliative Care follow-up planned  Yes, Facility   Other Treatment Preference Instructions  -- [comfort care]      Additional Data Reviewed: Recent Labs     01-11-15  0133  01-11-15  0142  WBC  9.0   --   HGB  15.3  16.0  PLT  75*   --   NA  134*  131*  BUN  27*  40*  CREATININE  1.87*  1.70*    Time In: 0930 Time Out: 1010 Time Total: 70 min Greater than 50%  of this time was spent counseling and coordinating care related to the above assessment and plan. Staffed with Dr. Sherral Hammers  Signed by: Dory Horn, NP  Dory Horn, NP  January 11, 2015, 10:44 AM  Please contact Palliative Medicine Team phone at [phone] for questions and concerns.

## 2015-01-12 NOTE — ED Notes (Signed)
Report attempted 

## 2015-01-12 NOTE — ED Notes (Signed)
Talked to the facility and they state that the pt is on radiation therapy for lung cancer. Facility states that the pt is normally alert and oriented and cooperative but just a little jittery. Facility states that the pt began to become altered yesterday. The pt's nurse states that the pt has became worse today throughout the day, has not voided and had a cxr and was diagnosed with pneumonia today. Pt is full code. Pt's mother notified and is closest of kin. Unknown if she is coming.

## 2015-01-12 NOTE — Progress Notes (Signed)
Chaplain responded to request for check-in following patient death. Mother, cousin, sister and nephew were present. Cousin stated that death was expected but not this soon.  Mom presented as stoic and other family members seemed accepting. Mother and cousin noted that this was mom's fourth child to die.  Family is [employer].  Clinical biochemist offered and family accepted prayer.  Please call as needed for further support.    Luana Shu [phone]    2014-12-30 1100  Clinical Encounter Type  Visited With Family  Visit Type Death  Referral From Nurse  Consult/Referral To Chaplain  Spiritual Encounters  Spiritual Needs Grief support;Prayer  Stress Factors  Family Stress Factors Major life changes

## 2015-01-12 NOTE — H&P (Signed)
[facility] History and Physical  Casey Charles PZW:[id number] DOB: 05-01-1945 DOA: 01/16/15  Referring physician: ED physician PCP: No PCP Per Patient  Specialists:   Chief Complaint: Altered mental status  HPI: Casey Charles is a 69 y.o. 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  Patient has AMS, and is unable to provide any medical history, therefore, most of the history is obtained by discussing the case with ED physician and per EMS report. The history is very limited. It seems that pt is from [facility] [place]. Pt was diagnosed with Pneumonia yesterday and started Levaquin. Facility stated that he was at his baseline earlier today and then tonight became altered. He has respiratory distress. When I saw pt in ED, he is not oriented x 3. Not able to answer any questions. He has tenderness in abdomen. He moves all extremities.  In ED, patient was found to have positive urinalysis with moderate amount of leukocytes, lactate 4.89, W79.0, temperature while 2.8, tachycardia, potassium 5.6 without EKG change, AKI with cre 1.87 and BNU 27, abnormal liver function with the AST 59, ALT 39, total bilirubin 1.9, ALP 74,. Chest x-ray showed central and a basilar opacities.  CT head is negative for acute abnormalities.  Where does patient live?  SNF    Can patient participate in ADLs? None    Review of Systems: Could not be reviewed due to altered mental status.  Allergy: No Known Allergies  Past Medical History  Diagnosis Date  . Chronic back pain   . COPD (chronic obstructive pulmonary disease) ([place])   . Hypertension     Past Surgical History  Procedure Laterality Date  . Endobronchial ultrasound Bilateral 11/11/2014    Procedure: ENDOBRONCHIAL ULTRASOUND;  Surgeon: Juanito Doom, MD;  Location:  [facility];  Service: Cardiopulmonary;  Laterality: Bilateral;    Social History:  reports that he has been smoking Cigarettes.  He has been smoking about 1.00 pack per day. He does not have any smokeless tobacco history on file. He reports that he drinks alcohol. He reports that he does not use illicit drugs.  Family History: Could not be reviewed due to altered mental status.  Prior to Admission medications   Medication Sig Start Date End Date Taking? Authorizing Provider  amLODipine (NORVASC) 10 MG tablet Take 1 tablet (10 mg total) by mouth daily. 11/14/14  Yes Kelvin Cellar, MD  ipratropium-albuterol (DUONEB) 0.5-2.5 (3) MG/3ML SOLN Take 3 mLs by nebulization every 6 (six) hours.   Yes Historical Provider, MD  isosorbide mononitrate (IMDUR) 30 MG 24 hr tablet Take 0.5 tablets (15 mg total) by mouth daily. 11/14/14  Yes Kelvin Cellar, MD  levofloxacin (LEVAQUIN) 750 MG tablet Take 750 mg by mouth daily. For 7 days starting 12/17/14 12/17/14 12/23/14 Yes Historical Provider, MD  Multiple Vitamin (MULTIVITAMIN WITH MINERALS) TABS tablet Take 1 tablet by mouth daily.   Yes Historical Provider, MD  nicotine (NICODERM CQ - DOSED IN MG/24 HOURS) 21 mg/24hr patch Place 21 mg onto the skin daily.   Yes Historical Provider, MD  acetaminophen (PAIN RELIEF) 325 MG tablet Take 650 mg by mouth every 6 (six) hours as needed for moderate pain.    Historical Provider, MD  dexamethasone (DECADRON) 2 MG tablet Take 4 mg PO TID. Patient not taking: Reported on January 16, 2015 11/16/14   Kyung Rudd,  MD  oxyCODONE-acetaminophen (PERCOCET/ROXICET) 5-325 MG tablet Take 1 tablet by mouth every 4 (four) hours as needed for severe pain. 11/16/14   Kyung Rudd, MD    Physical Exam: Filed Vitals:   12-Jan-2015 0430 2015/01/12 0445 Jan 12, 2015 0500 01-12-2015 0530  BP: 119/43 116/65 118/64 131/76  Pulse: 119 49 115 120  Temp:      TempSrc:      Resp: '22  31 27  '$ Height:      Weight:      SpO2: 93% 98% 94% 92%   General: Not  in acute distress. Dry mucus and membrane HEENT:       Eyes: PERRL, EOMI, no scleral icterus.       ENT: No discharge from the ears and nose, no pharynx injection, no tonsillar enlargement.        Neck: No JVD, no bruit, no mass felt. Heme: No neck lymph node enlargement. Cardiac: S1/S2, RRR, tachycardia, No murmurs, No gallops or rubs. Pulm: As decreased breathing sounds bilaterally, as rhonchi bilaterally, no rales or rubs. Abd: Soft, nondistended. Pt seems to have tenderness in periumbilical area to palpation, no organomegaly, BS present. Ext: No pitting leg edema bilaterally. 2+DP/PT pulse bilaterally. Musculoskeletal: No joint deformities, No joint redness or warmth, no limitation of ROM in spin. Skin: No rashes.  Neuro: AMS, not oriented X3, cranial nerves II-XII grossly intact, moves all extremities. Psych: Could not be reviewed due to altered mental status  Labs on Admission:  Basic Metabolic Panel:  Recent Labs Lab Jan 12, 2015 0133 2015-01-12 0142  NA 134* 131*  K 5.6* 5.5*  CL 97* 99*  CO2 20*  --   GLUCOSE 227* 227*  BUN 27* 40*  CREATININE 1.87* 1.70*  CALCIUM 8.6*  --    Liver Function Tests:  Recent Labs Lab January 12, 2015 0133  AST 59*  ALT 39  ALKPHOS 74  BILITOT 1.9*  PROT 6.3*  ALBUMIN 2.3*   No results for input(s): LIPASE, AMYLASE in the last 168 hours. No results for input(s): AMMONIA in the last 168 hours. CBC:  Recent Labs Lab January 12, 2015 0133 2015/01/12 0142  WBC 9.0  --   NEUTROABS 8.0*  --   HGB 15.3 16.0  HCT 43.5 47.0  MCV 89.0  --   PLT 75*  --    Cardiac Enzymes: No results for input(s): CKTOTAL, CKMB, CKMBINDEX, TROPONINI in the last 168 hours.  BNP (last 3 results) No results for input(s): BNP in the last 8760 hours.  ProBNP (last 3 results) No results for input(s): PROBNP in the last 8760 hours.  CBG: No results for input(s): GLUCAP in the last 168 hours.  Radiological Exams on Admission: Ct Head Wo Contrast  12-Jan-2015  CLINICAL  DATA:  Altered mental status, fever. Known brain metastasis. History of lung cancer, hypertension, urinary tract infection, alcohol abuse. EXAM: CT HEAD WITHOUT CONTRAST TECHNIQUE: Contiguous axial images were obtained from the base of the skull through the vertex without intravenous contrast. COMPARISON:  CT head November 07, 2014 FINDINGS: Greater than 25 supratentorial and infratentorial hemorrhagic brain metastasis again seen, waxing and waning appearance, a 19 mm pontine hemorrhagic metastasis was 17 mm. A LEFT mesial occipital lobe metastasis was 12 x 13 mm, now 19 x 26 mm. Many of the lesions show cystic degeneration. Limited assessment for new metastasis given different a Shim positioning within the scanner. Lesions show mild surrounding lobe vasogenic edema. No midline shift. Moderate ventriculomegaly on the basis of global parenchymal brain volume loss. No acute  large vascular territory infarcts. No abnormal extra-axial fluid collections. Basal cisterns are patent. Poor dentition. Paranasal sinuses are well-aerated. old suspected nasal bone fractures. Mastoid air cells are well aerated. Soft tissue within the external auditory canals most compatible with cerumen. No destructive bony lesions nor calvarial fracture. IMPRESSION: Greater than 25 intracranial hemorrhagic metastasis, or varying ages, mild local mass effect without midline shift. Electronically Signed   By: Elon Alas M.D.   On: January 08, 2015 04:18   Dg Chest Port 1 View  Jan 08, 2015  CLINICAL DATA:  Fever and dyspnea for 1 day EXAM: PORTABLE CHEST 1 VIEW COMPARISON:  11/07/2014 FINDINGS: There are central and basilar opacities bilaterally which are new or worsened. This may represent infectious infiltrate. No large effusions. There are abnormal mediastinal contours consistent with the known neoplastic disease. IMPRESSION: Central and basilar opacities, significantly worsened. This may represent infectious infiltrate. Electronically Signed    By: Andreas Newport M.D.   On: 08-Jan-2015 02:13    EKG: Independently reviewed.  QTC 443, tachycardia   Assessment/Plan Principal Problem:   HCAP (healthcare-associated pneumonia) Active Problems:   Acute encephalopathy   COPD (chronic obstructive pulmonary disease) (HCC)   Current smoker   Protein-calorie malnutrition, severe (HCC)   ETOH abuse   Palliative care encounter   Non-small cell cancer of right lung (HCC)   Sepsis (HCC)   UTI (lower urinary tract infection)   AKI (acute kidney injury) ([place])   Hyperkalemia   Hypertension   Abdominal pain  Sepsis 2/2 HCAP and UTI: Patient has HCAP as evidenced by CXR and UTI. Patient is septic on admission with elevated lactate, fever, tachycardia, tachypnea. He is hemodynamically stable. -will admit to SDU -start Vanco and zosyn --will get Procalcitonin and trend lactic acid levels per sepsis protocol. -IVF: 2.5 L of NS bolus in ED, followed by 125 cc/h   HCAP: - on  IV Vancomycin and Zosyn - Mucinex for cough  - DuoNeb Nebfor SOB - Urine legionella and S. pneumococcal antigen - Follow up blood culture x2, sputum culture, plus Flu pcr  UTI: -On  IV Vancomycin and Zosyn -f/u Ux   Acute encephalopathy: This is likely due to sepsis is secondary to HCAP and UTI. CT-head is negative for acute abnormalities. -Frequent neurochecks -Treat underlying HCAP and UTI as above  COPD (chronic obstructive pulmonary disease) ([place]): Basilar rhonchi bilaterally, indicating possible exacerbation - on DuoNeb Nebfor SOB -On vancomycin and Zosyn  Protein-calorie malnutrition, severe ([place]): -Ensure when able to eat  Non-small cell cancer of right lung [facility]) with metastatic disease to brain: has been followed up by Dr. Burr Medico. Last seen was on 12/16/14. Per Dr. Ernestina Penna note, pt has right middle lobe non-small cell lung cancer, [id number], with diffuse brain metastasis, and bilateral adrenal masses. Overall very poor prognosis. Not a good candidate  for cytotoxic chemotherapy.  - follow up with Dr. Burr Medico - Need palliative care and hospice. I will put order in for palliative consult  AKI: Likely due to UTI and prerenal secondary to dehydration and continuation  - IVF as above - Check FeNa - US-renal - Follow up renal function by BMP  HTN:  -hold amlodipine and Imdur since patient is at risk of developing hypotension due to sepsis -IV hydralazine when necessary  Possible abdominal pain: He seems to have tenderness over periumbilical area on examination. -check lipase -CT-abd/pelvis w/o CM  Hyperkalemia: K=5.6 without EKG change. -Kayexalate 30 g 1 -Follow up by BMP  Tobacco abuse and Alcohol abuse: -Nicotine patch -CIWA  protocol  Goal of care: Overall patient has very poor prognosis. Need to discuss with family about the goal of care in AM.  DVT ppx: SQ Heparin   Code Status: DNR Family Communication: None at bed side. Disposition Plan: Admit to inpatient   Date of Service 15-Jan-2015    Ivor Costa [facility] Pager [phone]  If 7PM-7AM, please contact night-coverage [email or web address] Password [email or web address] 01/15/2015, 5:52 AM

## 2015-01-12 NOTE — Progress Notes (Signed)
Patient's body released to morgue.  Bed control notified.  Family returned home.

## 2015-01-12 NOTE — Progress Notes (Signed)
Respirations ceased.  No audible heart sounds.  No pulse.  HR asystole.  Pronounced by this nurse and Lillia Corporal, RN.  Palliative care nurse and MD notified.

## 2015-01-12 NOTE — Discharge Summary (Signed)
Death Summary  Darvis Croft [place] QJF:[id number] DOB: 30-Apr-1945 DOA: 12/28/2014  PCP: No PCP Per Patient PCP/Office notified: No  Admit date: 2014-12-28 Date of Death: January 13, 2015  Final Diagnoses:  Principal Problem:   HCAP (healthcare-associated pneumonia) Active Problems:   Acute encephalopathy   COPD (chronic obstructive pulmonary disease) ([place])   Current smoker   Protein-calorie malnutrition, severe (HCC)   ETOH abuse   Palliative care encounter   Non-small cell cancer of right lung (HCC)   Sepsis ([place])   UTI (lower urinary tract infection)   AKI (acute kidney injury) ([place])   Hyperkalemia   Hypertension   Abdominal pain   Metastatic primary lung cancer (HCC)   COPD exacerbation (HCC)   Tobacco abuse  Sepsis 2/2 HCAP and UTI:  -Patient has HCAP as evidenced by CXR and UTI. Patient is septic on admission with elevated lactate, fever, tachycardia, tachypnea. He is hemodynamically stable. -start Vanco and zosyn -IVF: 2.5 L of NS bolus in ED, followed by 125 cc/h  -Was seen by NPSarah Hughes [facility] palliative care medicine and family decided on comfort care  HCAP: - on IV Vancomycin and Zosyn - Mucinex for cough  - DuoNeb Nebfor SOB - Urine legionella and S. pneumococcal antigen - Follow up blood culture x2, sputum culture, plus Flu pcr ADDENDUM;family decided on comfort care  UTI: -See HCAP  Acute encephalopathy:  -This is likely due to sepsis is secondary to HCAP and UTI. CT-head is negative for acute abnormalities. -Frequent neurochecks -Treat underlying HCAP and UTI as above  COPD (chronic obstructive pulmonary disease) ([place]):  -Basilar rhonchi bilaterally, indicating possible exacerbation - on DuoNeb Nebfor SOB -On vancomycin and Zosyn -ADDENDUM;family decided on comfort care  Protein-calorie malnutrition, severe ([place]): -Ensure when able to eat  Non-small cell cancer of right lung ([place]) with metastatic disease to brain:  -Followed up by Dr. Truitt Merle  (oncology). Last seen was on 12/16/14. Per Dr. Ernestina Penna note, pt has right middle lobe non-small cell lung cancer, [id number], with diffuse brain metastasis, and bilateral adrenal masses. Overall very poor prognosis. Not a good candidate for cytotoxic chemotherapy.  - finished whole brain and thoracic radiation on 11/25/2014 - Palliative care consult placed; family decided on comfort care  AKI:  -Likely due to UTI and prerenal secondary to dehydration and continuation  - IVF as above - Check FeNa - US-renal - Follow up renal function by BMP  HTN:  -hold amlodipine and Imdur since patient is at risk of developing hypotension due to sepsis -IV hydralazine when necessary  Possible abdominal pain:  -He seems to have tenderness over periumbilical area on examination. -check lipase -CT-abd/pelvis w/o CM  Hyperkalemia:  -K=5.6 without EKG change. -Kayexalate 30 g 1   Tobacco abuse and Alcohol abuse: -Nicotine patch -CIWA protocol    History of present illness:  69 y.o. 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  Patient has AMS, and is unable to provide any medical history, therefore, most of the history is obtained by discussing the case with ED physician and per EMS report. The history is very limited. It seems that pt is from [facility] and [place]. Pt was diagnosed with Pneumonia yesterday and started Levaquin. Facility stated that he was at his baseline earlier today and then tonight became altered. He has respiratory distress. When I saw pt in ED, he is  not oriented x 3. Not able to answer any questions. He has tenderness in abdomen. He moves all extremities.  In ED, patient was found to have positive urinalysis with moderate amount of leukocytes, lactate 4.89, W79.0, temperature while 2.8, tachycardia,  potassium 5.6 without EKG change, AKI with cre 1.87 and BNU 27, abnormal liver function with the AST 59, ALT 39, total bilirubin 1.9, ALP 74,. Chest x-ray showed central and a basilar opacities.  CT head is negative for acute abnormalities. Patient is actively dying and family decided on comfort care. At 1047 patient was found by RN Elmarie Shiley, to be in asystole and pulseless.    Time: Time of death 44  Signed:  Dia Crawford, MD [facility] [phone]

## 2015-01-12 DEATH — deceased

## 2015-02-05 ENCOUNTER — Other Ambulatory Visit: Payer: Self-pay | Admitting: Hematology

## 2017-06-30 IMAGING — MR MR TOTAL SPINE METS SCREENING
4 of 10 series · 17 of 48 positions shown · IV contrast (Yes)
Comparison: No comparison MR of the spine.

CLINICAL DATA: 69-year-old male with metastatic lung cancer. Sudden
onset upper extremity numbness and weakness progress into left lower
extremity later in the day. Subsequent encounter.

EXAM:
MRI TOTAL SPINE WITHOUT AND WITH CONTRAST
TECHNIQUE: Multisequence MR imaging of the spine from the cervical spine to the
sacrum was performed prior to and following IV contrast
administration for evaluation of spinal metastatic disease.
CONTRAST:  11mL MULTIHANCE GADOBENATE DIMEGLUMINE 529 MG/ML IV SOLN

[Series 5: T1 · sagittal · 3.0mm · 0.64mm/px · 4 of 12 slices shown (1 of 2)]
[im 1/12]
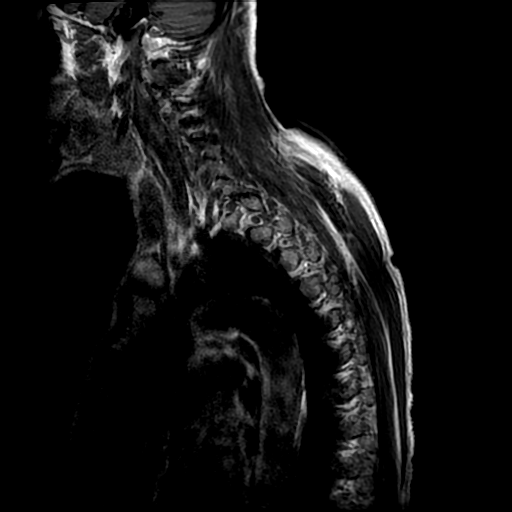
[im 3/12]
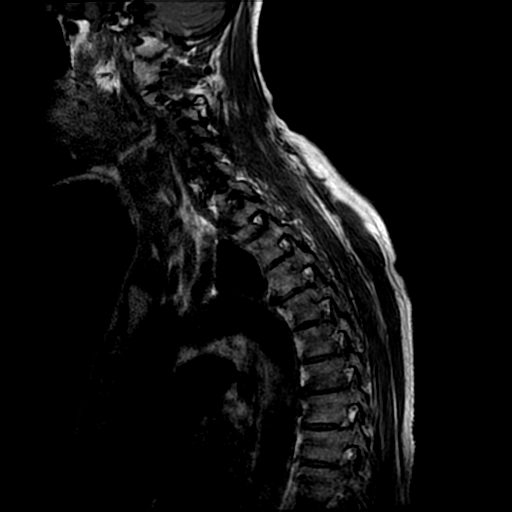
[im 6/12]
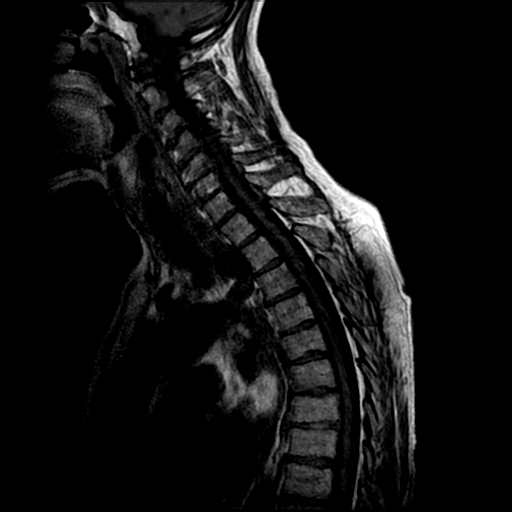
[im 12/12]
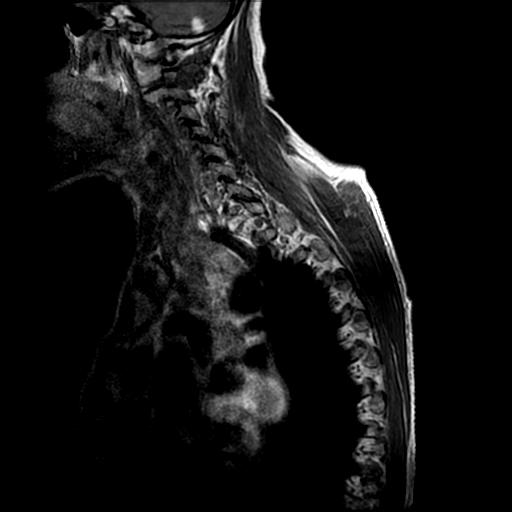

[Series 9: T1 · sagittal · 4.0mm · 0.68mm/px · 3 of 15 slices shown (2 of 2)]
[im 1/15]
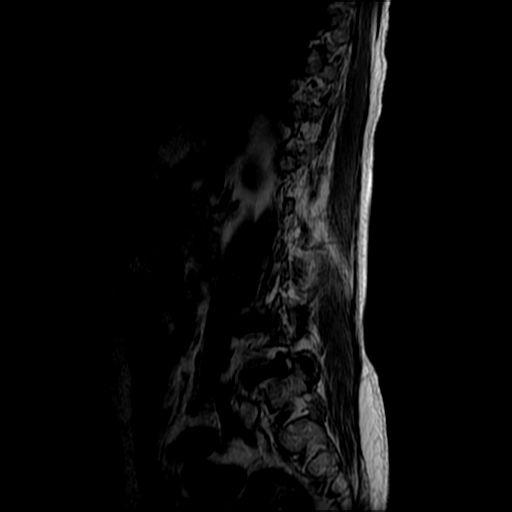
[im 8/15]
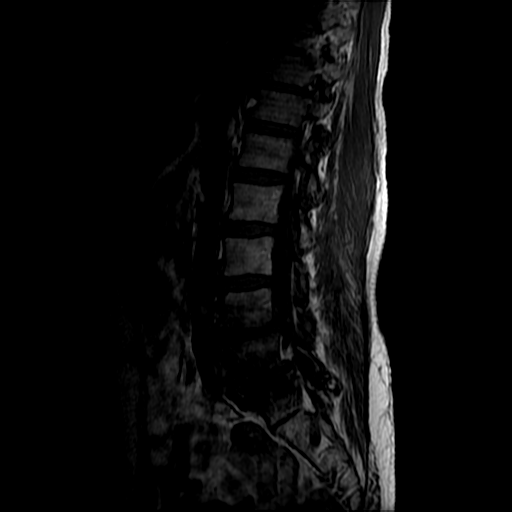
[im 15/15]
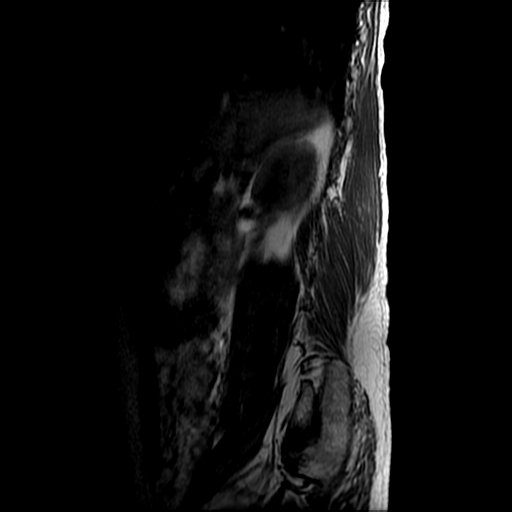

[Series 11: T2 post-contrast · sagittal · 4.0mm · 0.68mm/px · 5 of 15 slices shown (1 of 2)]
[im 1/15]
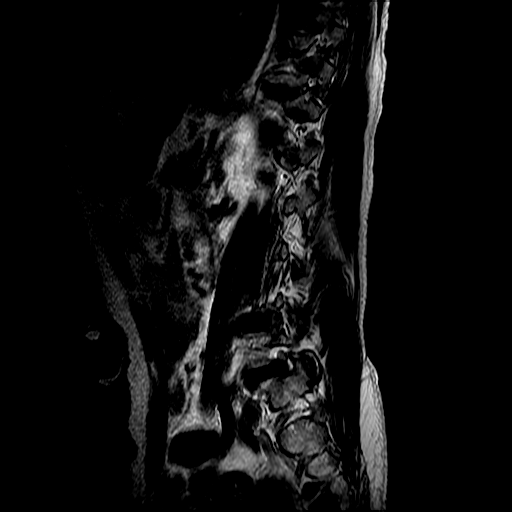
[im 4/15]
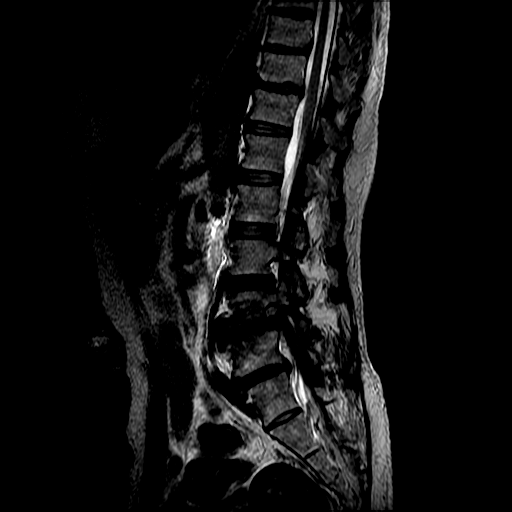
[im 8/15]
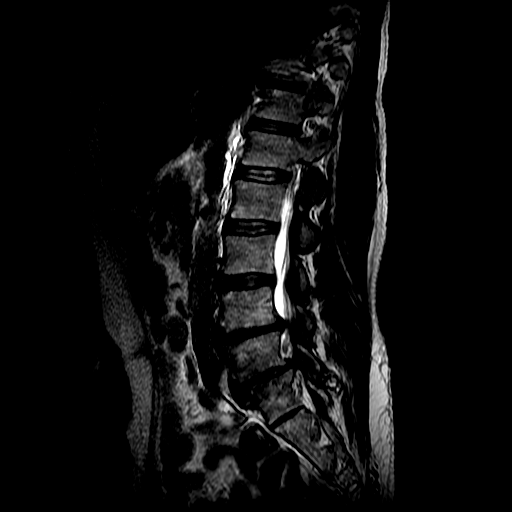
[im 11/15]
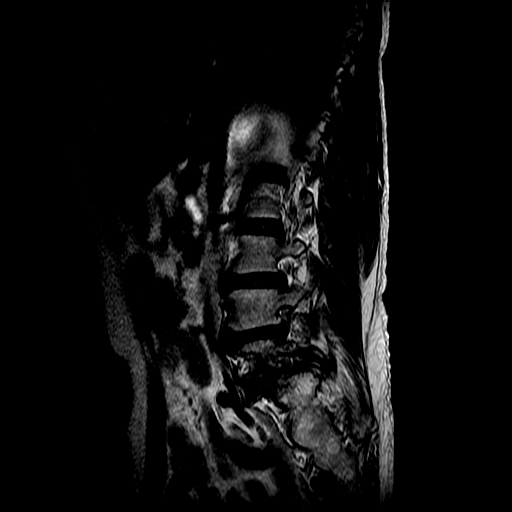
[im 15/15]
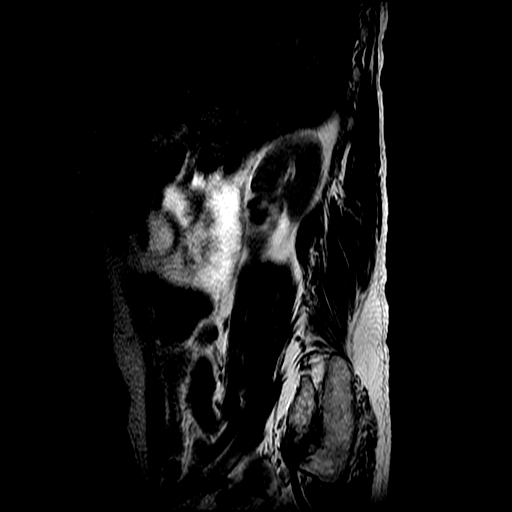

[Series 16: T2 post-contrast · sagittal · 3.0mm · 0.64mm/px · 5 of 13 slices shown (2 of 2)]
[im 1/13]
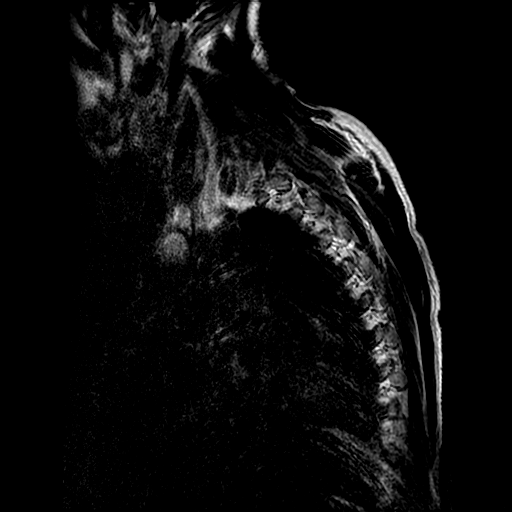
[im 4/13]
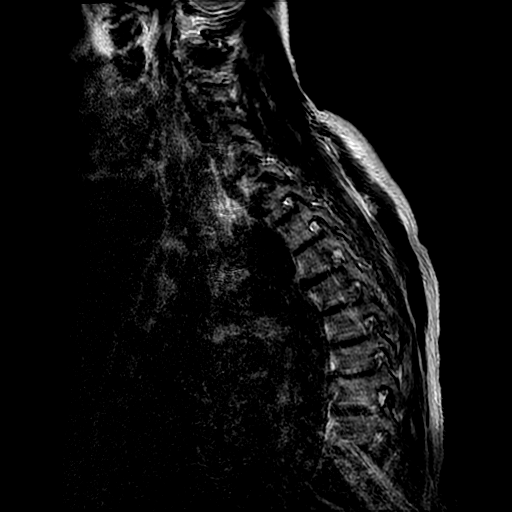
[im 7/13]
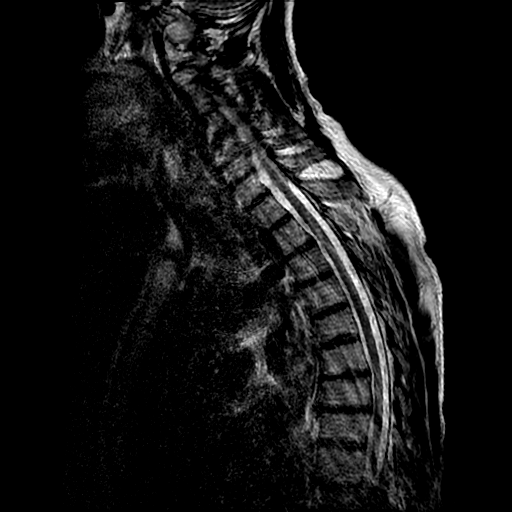
[im 10/13]
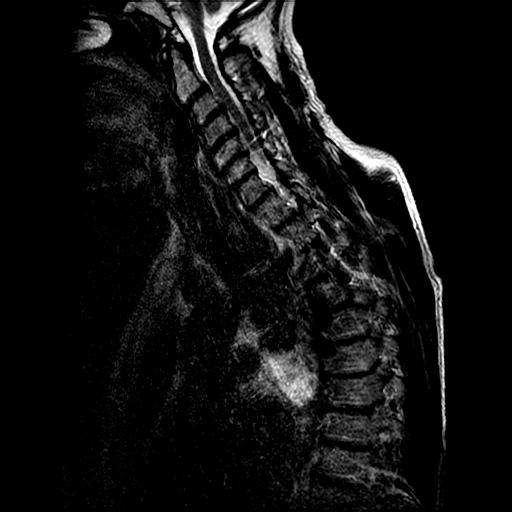
[im 13/13]
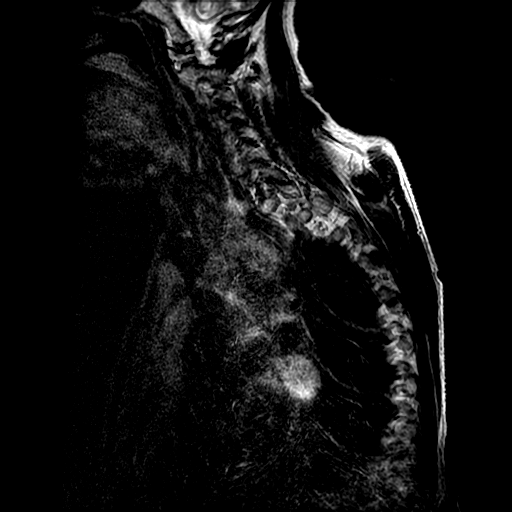

[17 of 48 positions shown; findings below may reference images not displayed]

Comparison head CT
11/07/2014. Comparison CT scan of the chest, abdomen and pelvis
11/07/2014.
FINDINGS: Exam is motion degraded. The patient was uncomfortable throughout
the examination and moving. Only sagittal imaging was able to be
performed.

Multiple intracranial metastatic lesions are noted as on recent CT.

Thoracic malignancy as noted on recent CT.

Cervical Findings:

No osseous destructive lesion or abnormal cord enhancing lesion to
suggest metastatic disease at the level of the cervical spine.

Cervical spondylotic changes with bulges/protrusions contributing to
spinal stenosis most prominent C3-4 thru C5-6 where there is cord
contact and deformity most prominent C4-5 level. Axial images not
obtained.

Thoracic Findings:

No osseous destructive lesion or abnormal cord enhancing lesion to
suggest metastatic disease at the level of the thoracic spine.

T2-3 minimal bulge.

T6-7 mild bulge/spur with mild narrowing ventral aspect of the
thecal sac and minimal posterior cord deflection.

T10-11 minimal bulge.

Lumbar Findings:

Sacralized L5 vertebral body with rudimentary disc at the L5-S1
level.

Abnormal signal within the L3, L4 and L5 vertebral body felt to be
related to discogenic changes rather than metastatic disease.
Overall, no enhancing lesion to suggest metastatic disease involving
the lumbar spine.

L3-4 and L4-5 significant facet joint degenerative changes and disc
degeneration. Minimal anterior slip L3 and L4. Bulge and osteophyte.

L3-4 moderate spinal stenosis, moderate to marked right-sided and
marked to severe left-sided foraminal narrowing with mass effect
upon the exiting L3 nerve roots greater on the left.

L4-5 mild spinal stenosis. Moderate right-sided and mild-to-moderate
left-sided foraminal narrowing with slight encroachment upon the
course exiting right L4 nerve root.

L2-3 mild bulge.
IMPRESSION: Exam is motion degraded. The patient was uncomfortable throughout
the examination and moving. Only sagittal imaging was able to be
performed.

Multiple intracranial metastatic lesions are noted as on recent CT.

Thoracic malignancy as noted on recent CT.

No osseous destructive lesion or abnormal cord enhancing lesion to
suggest metastatic disease involving the cervical, thoracic or
lumbar spine.

Degenerative changes including:

CERVICAL:

Cervical spondylotic changes with bulges/protrusions contributing to
spinal stenosis most prominent C3-4 thru C5-6 where there is cord
contact and deformity most prominent C4-5 level. Axial images not
obtained.

THORACIC:

T2-3 minimal bulge.

T6-7 mild bulge/spur with mild narrowing ventral aspect of the
thecal sac and minimal posterior cord deflection.

T10-11 minimal bulge.

LUMBAR:

Sacralized L5 vertebral body with rudimentary disc at the L5-S1
level.

Abnormal signal within the L3, L4 and L5 vertebral body felt to be
related to discogenic changes rather than metastatic disease.

L3-4 and L4-5 significant facet joint degenerative changes and disc
degeneration. Minimal anterior slip L3 and L4. Bulge and osteophyte.

L3-4 moderate spinal stenosis, moderate to marked right-sided and
marked to severe left-sided foraminal narrowing with mass effect
upon the exiting L3 nerve roots greater on the left.

L4-5 mild spinal stenosis. Moderate right-sided and mild-to-moderate
left-sided foraminal narrowing with slight encroachment upon the
course exiting right L4 nerve root.
